# Patient Record
Sex: Female | Born: 1955 | Race: Black or African American | Hispanic: No | Marital: Married | Smoking: Never smoker
Health system: Southern US, Community
[De-identification: ages and names within clinical notes are randomized; demographics above are authoritative.]

## PROBLEM LIST (undated history)

## (undated) DIAGNOSIS — B019 Varicella without complication: Secondary | ICD-10-CM

## (undated) DIAGNOSIS — J309 Allergic rhinitis, unspecified: Secondary | ICD-10-CM

## (undated) DIAGNOSIS — D649 Anemia, unspecified: Secondary | ICD-10-CM

## (undated) DIAGNOSIS — K219 Gastro-esophageal reflux disease without esophagitis: Secondary | ICD-10-CM

## (undated) DIAGNOSIS — Z9889 Other specified postprocedural states: Secondary | ICD-10-CM

## (undated) DIAGNOSIS — K644 Residual hemorrhoidal skin tags: Secondary | ICD-10-CM

## (undated) DIAGNOSIS — K645 Perianal venous thrombosis: Secondary | ICD-10-CM

## (undated) DIAGNOSIS — Z0000 Encounter for general adult medical examination without abnormal findings: Principal | ICD-10-CM

## (undated) DIAGNOSIS — R0989 Other specified symptoms and signs involving the circulatory and respiratory systems: Secondary | ICD-10-CM

## (undated) DIAGNOSIS — Z8669 Personal history of other diseases of the nervous system and sense organs: Secondary | ICD-10-CM

## (undated) DIAGNOSIS — E663 Overweight: Secondary | ICD-10-CM

## (undated) DIAGNOSIS — G8929 Other chronic pain: Secondary | ICD-10-CM

## (undated) DIAGNOSIS — R0609 Other forms of dyspnea: Secondary | ICD-10-CM

## (undated) DIAGNOSIS — M549 Dorsalgia, unspecified: Secondary | ICD-10-CM

## (undated) HISTORY — DX: Anemia, unspecified: D64.9

## (undated) HISTORY — DX: Gastro-esophageal reflux disease without esophagitis: K21.9

## (undated) HISTORY — DX: Varicella without complication: B01.9

## (undated) HISTORY — DX: Personal history of other diseases of the nervous system and sense organs: Z86.69

## (undated) HISTORY — DX: Other forms of dyspnea: R06.09

## (undated) HISTORY — DX: Other specified postprocedural states: Z98.89

## (undated) HISTORY — DX: Residual hemorrhoidal skin tags: K64.4

## (undated) HISTORY — DX: Encounter for general adult medical examination without abnormal findings: Z00.00

## (undated) HISTORY — DX: Other chronic pain: G89.29

## (undated) HISTORY — DX: Perianal venous thrombosis: K64.5

## (undated) HISTORY — DX: Other specified symptoms and signs involving the circulatory and respiratory systems: R09.89

## (undated) HISTORY — DX: Allergic rhinitis, unspecified: J30.9

## (undated) HISTORY — DX: Dorsalgia, unspecified: M54.9

## (undated) HISTORY — PX: TUBAL LIGATION: SHX77

## (undated) HISTORY — PX: OTHER SURGICAL HISTORY: SHX169

## (undated) HISTORY — DX: Overweight: E66.3

## (undated) HISTORY — PX: HEMORRHOID SURGERY: SHX153

## (undated) HISTORY — PX: DILATION AND CURETTAGE OF UTERUS: SHX78

---

## 1997-07-21 ENCOUNTER — Ambulatory Visit (HOSPITAL_COMMUNITY): Admission: RE | Admit: 1997-07-21 | Discharge: 1997-07-21 | Payer: Self-pay | Admitting: Internal Medicine

## 1997-12-11 ENCOUNTER — Ambulatory Visit (HOSPITAL_COMMUNITY): Admission: RE | Admit: 1997-12-11 | Discharge: 1997-12-11 | Payer: Self-pay | Admitting: Obstetrics and Gynecology

## 1999-03-24 ENCOUNTER — Ambulatory Visit (HOSPITAL_COMMUNITY): Admission: RE | Admit: 1999-03-24 | Discharge: 1999-03-24 | Payer: Self-pay | Admitting: Internal Medicine

## 1999-03-24 ENCOUNTER — Encounter: Payer: Self-pay | Admitting: Internal Medicine

## 1999-07-14 ENCOUNTER — Encounter: Payer: Self-pay | Admitting: Internal Medicine

## 1999-07-14 ENCOUNTER — Ambulatory Visit (HOSPITAL_COMMUNITY): Admission: RE | Admit: 1999-07-14 | Discharge: 1999-07-14 | Payer: Self-pay | Admitting: Internal Medicine

## 2001-08-10 ENCOUNTER — Other Ambulatory Visit: Admission: RE | Admit: 2001-08-10 | Discharge: 2001-08-10 | Payer: Self-pay | Admitting: Internal Medicine

## 2001-08-17 ENCOUNTER — Encounter: Payer: Self-pay | Admitting: Internal Medicine

## 2001-08-17 ENCOUNTER — Encounter: Admission: RE | Admit: 2001-08-17 | Discharge: 2001-08-17 | Payer: Self-pay | Admitting: Internal Medicine

## 2002-08-26 ENCOUNTER — Encounter: Payer: Self-pay | Admitting: Internal Medicine

## 2002-08-26 ENCOUNTER — Other Ambulatory Visit: Admission: RE | Admit: 2002-08-26 | Discharge: 2002-08-26 | Payer: Self-pay | Admitting: Internal Medicine

## 2002-08-26 ENCOUNTER — Encounter: Admission: RE | Admit: 2002-08-26 | Discharge: 2002-08-26 | Payer: Self-pay | Admitting: Internal Medicine

## 2003-08-29 ENCOUNTER — Encounter: Admission: RE | Admit: 2003-08-29 | Discharge: 2003-08-29 | Payer: Self-pay | Admitting: Internal Medicine

## 2004-08-20 ENCOUNTER — Ambulatory Visit: Payer: Self-pay | Admitting: Internal Medicine

## 2004-08-27 ENCOUNTER — Other Ambulatory Visit: Admission: RE | Admit: 2004-08-27 | Discharge: 2004-08-27 | Payer: Self-pay | Admitting: Internal Medicine

## 2004-08-27 ENCOUNTER — Ambulatory Visit: Payer: Self-pay | Admitting: Internal Medicine

## 2004-09-07 ENCOUNTER — Encounter: Admission: RE | Admit: 2004-09-07 | Discharge: 2004-09-07 | Payer: Self-pay | Admitting: Internal Medicine

## 2004-11-24 ENCOUNTER — Ambulatory Visit: Payer: Self-pay | Admitting: Internal Medicine

## 2005-09-27 ENCOUNTER — Ambulatory Visit: Payer: Self-pay | Admitting: Internal Medicine

## 2005-09-30 ENCOUNTER — Encounter: Admission: RE | Admit: 2005-09-30 | Discharge: 2005-09-30 | Payer: Self-pay | Admitting: Internal Medicine

## 2005-10-06 ENCOUNTER — Ambulatory Visit: Payer: Self-pay | Admitting: Internal Medicine

## 2005-10-06 ENCOUNTER — Other Ambulatory Visit: Admission: RE | Admit: 2005-10-06 | Discharge: 2005-10-06 | Payer: Self-pay | Admitting: Internal Medicine

## 2005-10-06 ENCOUNTER — Encounter: Admission: RE | Admit: 2005-10-06 | Discharge: 2005-10-06 | Payer: Self-pay | Admitting: Internal Medicine

## 2005-12-02 ENCOUNTER — Ambulatory Visit: Payer: Self-pay | Admitting: Internal Medicine

## 2006-01-11 ENCOUNTER — Encounter: Admission: RE | Admit: 2006-01-11 | Discharge: 2006-01-11 | Payer: Self-pay | Admitting: Internal Medicine

## 2006-01-31 LAB — HM COLONOSCOPY: HM Colonoscopy: NORMAL

## 2006-05-31 ENCOUNTER — Ambulatory Visit: Payer: Self-pay | Admitting: Internal Medicine

## 2006-10-02 LAB — CONVERTED CEMR LAB

## 2006-10-04 ENCOUNTER — Encounter: Admission: RE | Admit: 2006-10-04 | Discharge: 2006-10-04 | Payer: Self-pay | Admitting: Internal Medicine

## 2006-10-16 ENCOUNTER — Ambulatory Visit: Payer: Self-pay | Admitting: Internal Medicine

## 2006-11-08 ENCOUNTER — Ambulatory Visit: Payer: Self-pay | Admitting: Gastroenterology

## 2006-11-09 ENCOUNTER — Ambulatory Visit: Payer: Self-pay | Admitting: Internal Medicine

## 2006-11-20 ENCOUNTER — Ambulatory Visit: Payer: Self-pay | Admitting: Gastroenterology

## 2006-11-22 ENCOUNTER — Ambulatory Visit: Payer: Self-pay | Admitting: Internal Medicine

## 2006-11-22 LAB — CONVERTED CEMR LAB
Albumin: 3.3 g/dL — ABNORMAL LOW (ref 3.5–5.2)
BUN: 4 mg/dL — ABNORMAL LOW (ref 6–23)
Basophils Absolute: 0 10*3/uL (ref 0.0–0.1)
Bilirubin Urine: NEGATIVE
Chloride: 111 meq/L (ref 96–112)
Cholesterol: 171 mg/dL (ref 0–200)
Eosinophils Absolute: 0.1 10*3/uL (ref 0.0–0.6)
GFR calc non Af Amer: 112 mL/min
HCT: 32.5 % — ABNORMAL LOW (ref 36.0–46.0)
HDL: 39.3 mg/dL (ref >39.0)
Hemoglobin, Urine: NEGATIVE
Ketones, ur: NEGATIVE mg/dL
LDL Cholesterol: 124 mg/dL — ABNORMAL HIGH (ref 0–99)
MCHC: 33.5 g/dL (ref 30.0–36.0)
MCV: 86.9 fL (ref 78.0–100.0)
Monocytes Absolute: 0.5 10*3/uL (ref 0.2–0.7)
Monocytes Relative: 9.7 % (ref 3.0–11.0)
Neutrophils Relative %: 50.5 % (ref 43.0–77.0)
Potassium: 4.3 meq/L (ref 3.5–5.1)
RBC: 3.73 M/uL — ABNORMAL LOW (ref 3.87–5.11)
Sodium: 144 meq/L (ref 135–145)
TSH: 1.58 microintl units/mL (ref 0.35–5.50)
Total CHOL/HDL Ratio: 4.4
Triglycerides: 40 mg/dL (ref 0–149)
Urobilinogen, UA: 0.2 (ref 0.0–1.0)

## 2007-02-07 ENCOUNTER — Encounter: Payer: Self-pay | Admitting: *Deleted

## 2007-02-07 DIAGNOSIS — C50919 Malignant neoplasm of unspecified site of unspecified female breast: Secondary | ICD-10-CM | POA: Insufficient documentation

## 2007-02-07 DIAGNOSIS — Z9010 Acquired absence of unspecified breast and nipple: Secondary | ICD-10-CM | POA: Insufficient documentation

## 2007-02-07 DIAGNOSIS — J309 Allergic rhinitis, unspecified: Secondary | ICD-10-CM | POA: Insufficient documentation

## 2007-02-07 DIAGNOSIS — Z8669 Personal history of other diseases of the nervous system and sense organs: Secondary | ICD-10-CM | POA: Insufficient documentation

## 2007-02-07 DIAGNOSIS — M549 Dorsalgia, unspecified: Secondary | ICD-10-CM | POA: Insufficient documentation

## 2007-02-07 DIAGNOSIS — Z9889 Other specified postprocedural states: Secondary | ICD-10-CM | POA: Insufficient documentation

## 2007-02-07 DIAGNOSIS — K645 Perianal venous thrombosis: Secondary | ICD-10-CM

## 2007-10-05 ENCOUNTER — Encounter: Admission: RE | Admit: 2007-10-05 | Discharge: 2007-10-05 | Payer: Self-pay | Admitting: Internal Medicine

## 2007-10-29 ENCOUNTER — Ambulatory Visit: Payer: Self-pay | Admitting: Internal Medicine

## 2007-11-09 ENCOUNTER — Telehealth: Payer: Self-pay | Admitting: Internal Medicine

## 2007-11-10 ENCOUNTER — Ambulatory Visit: Payer: Self-pay | Admitting: Family Medicine

## 2007-11-10 ENCOUNTER — Telehealth (INDEPENDENT_AMBULATORY_CARE_PROVIDER_SITE_OTHER): Payer: Self-pay | Admitting: *Deleted

## 2007-11-10 DIAGNOSIS — J029 Acute pharyngitis, unspecified: Secondary | ICD-10-CM | POA: Insufficient documentation

## 2007-11-16 ENCOUNTER — Ambulatory Visit: Payer: Self-pay | Admitting: Internal Medicine

## 2007-11-16 LAB — CONVERTED CEMR LAB
Albumin: 3.5 g/dL (ref 3.5–5.2)
Alkaline Phosphatase: 64 units/L (ref 39–117)
BUN: 5 mg/dL — ABNORMAL LOW (ref 6–23)
Bilirubin Urine: NEGATIVE
Cholesterol: 159 mg/dL (ref 0–200)
Eosinophils Absolute: 0.1 10*3/uL (ref 0.0–0.7)
Eosinophils Relative: 1.1 % (ref 0.0–5.0)
GFR calc Af Amer: 168 mL/min
GFR calc non Af Amer: 139 mL/min
HCT: 34.4 % — ABNORMAL LOW (ref 36.0–46.0)
HDL: 31.1 mg/dL — ABNORMAL LOW (ref >39.0)
Hemoglobin, Urine: NEGATIVE
Ketones, ur: NEGATIVE mg/dL
MCV: 86.9 fL (ref 78.0–100.0)
Monocytes Absolute: 0.5 10*3/uL (ref 0.1–1.0)
Platelets: 295 10*3/uL (ref 150–400)
Potassium: 3.7 meq/L (ref 3.5–5.1)
RDW: 12.4 % (ref 11.5–14.6)
TSH: 0.6 microintl units/mL (ref 0.35–5.50)
Total Protein, Urine: NEGATIVE mg/dL
Triglycerides: 57 mg/dL (ref 0–149)
Urine Glucose: NEGATIVE mg/dL
VLDL: 11 mg/dL (ref 0–40)
WBC: 6.6 10*3/uL (ref 4.5–10.5)

## 2007-11-20 ENCOUNTER — Ambulatory Visit: Payer: Self-pay | Admitting: Internal Medicine

## 2007-11-28 ENCOUNTER — Telehealth: Payer: Self-pay | Admitting: Internal Medicine

## 2008-03-05 ENCOUNTER — Ambulatory Visit: Payer: Self-pay | Admitting: Internal Medicine

## 2008-03-05 ENCOUNTER — Telehealth: Payer: Self-pay | Admitting: Internal Medicine

## 2008-08-22 ENCOUNTER — Telehealth: Payer: Self-pay | Admitting: Internal Medicine

## 2008-08-26 ENCOUNTER — Encounter (INDEPENDENT_AMBULATORY_CARE_PROVIDER_SITE_OTHER): Payer: Self-pay | Admitting: *Deleted

## 2008-09-10 ENCOUNTER — Ambulatory Visit: Payer: Self-pay | Admitting: Pulmonary Disease

## 2008-09-10 DIAGNOSIS — G4733 Obstructive sleep apnea (adult) (pediatric): Secondary | ICD-10-CM

## 2008-10-03 ENCOUNTER — Ambulatory Visit (HOSPITAL_BASED_OUTPATIENT_CLINIC_OR_DEPARTMENT_OTHER): Admission: RE | Admit: 2008-10-03 | Discharge: 2008-10-03 | Payer: Self-pay | Admitting: Pulmonary Disease

## 2008-10-03 ENCOUNTER — Encounter: Payer: Self-pay | Admitting: Pulmonary Disease

## 2008-10-12 ENCOUNTER — Ambulatory Visit: Payer: Self-pay | Admitting: Pulmonary Disease

## 2008-10-13 ENCOUNTER — Telehealth (INDEPENDENT_AMBULATORY_CARE_PROVIDER_SITE_OTHER): Payer: Self-pay | Admitting: *Deleted

## 2008-10-22 ENCOUNTER — Encounter: Admission: RE | Admit: 2008-10-22 | Discharge: 2008-10-22 | Payer: Self-pay | Admitting: Internal Medicine

## 2008-10-24 ENCOUNTER — Telehealth: Payer: Self-pay | Admitting: Pulmonary Disease

## 2008-10-24 ENCOUNTER — Ambulatory Visit: Payer: Self-pay | Admitting: Pulmonary Disease

## 2008-10-30 ENCOUNTER — Ambulatory Visit: Payer: Self-pay | Admitting: Internal Medicine

## 2008-11-12 ENCOUNTER — Ambulatory Visit: Payer: Self-pay | Admitting: Internal Medicine

## 2008-11-12 LAB — CONVERTED CEMR LAB
Alkaline Phosphatase: 56 units/L (ref 39–117)
BUN: 8 mg/dL (ref 6–23)
Bilirubin, Direct: 0.4 mg/dL — ABNORMAL HIGH (ref 0.0–0.3)
Calcium: 8.9 mg/dL (ref 8.4–10.5)
Cholesterol: 202 mg/dL — ABNORMAL HIGH (ref 0–200)
Creatinine, Ser: 0.6 mg/dL (ref 0.4–1.2)
Direct LDL: 156.1 mg/dL
Eosinophils Relative: 1.1 % (ref 0.0–5.0)
HCT: 34.9 % — ABNORMAL LOW (ref 36.0–46.0)
Hemoglobin, Urine: NEGATIVE
Hemoglobin: 11.5 g/dL — ABNORMAL LOW (ref 12.0–15.0)
MCV: 89.3 fL (ref 78.0–100.0)
Monocytes Relative: 6 % (ref 3.0–12.0)
Nitrite: NEGATIVE
Platelets: 276 10*3/uL (ref 150.0–400.0)
Specific Gravity, Urine: 1.01 (ref 1.000–1.030)
Total Bilirubin: 1.2 mg/dL (ref 0.3–1.2)
Total Protein, Urine: NEGATIVE mg/dL
Total Protein: 6.9 g/dL (ref 6.0–8.3)
Triglycerides: 55 mg/dL (ref 0.0–149.0)
Urine Glucose: NEGATIVE mg/dL
WBC: 7.6 10*3/uL (ref 4.5–10.5)
pH: 7 (ref 5.0–8.0)

## 2008-11-20 ENCOUNTER — Encounter: Payer: Self-pay | Admitting: Internal Medicine

## 2008-11-20 ENCOUNTER — Ambulatory Visit: Payer: Self-pay | Admitting: Internal Medicine

## 2008-11-20 ENCOUNTER — Other Ambulatory Visit: Admission: RE | Admit: 2008-11-20 | Discharge: 2008-11-20 | Payer: Self-pay | Admitting: Internal Medicine

## 2008-11-20 DIAGNOSIS — E785 Hyperlipidemia, unspecified: Secondary | ICD-10-CM

## 2008-12-08 ENCOUNTER — Encounter: Payer: Self-pay | Admitting: Internal Medicine

## 2009-01-20 ENCOUNTER — Telehealth: Payer: Self-pay | Admitting: Internal Medicine

## 2009-02-26 ENCOUNTER — Telehealth: Payer: Self-pay | Admitting: Internal Medicine

## 2009-04-24 ENCOUNTER — Telehealth: Payer: Self-pay | Admitting: Internal Medicine

## 2009-04-28 ENCOUNTER — Ambulatory Visit: Payer: Self-pay | Admitting: Internal Medicine

## 2009-05-01 ENCOUNTER — Telehealth: Payer: Self-pay | Admitting: Internal Medicine

## 2009-05-01 ENCOUNTER — Encounter (INDEPENDENT_AMBULATORY_CARE_PROVIDER_SITE_OTHER): Payer: Self-pay | Admitting: *Deleted

## 2009-06-02 ENCOUNTER — Encounter (INDEPENDENT_AMBULATORY_CARE_PROVIDER_SITE_OTHER): Payer: Self-pay | Admitting: *Deleted

## 2009-06-02 ENCOUNTER — Ambulatory Visit: Payer: Self-pay | Admitting: Internal Medicine

## 2009-06-02 LAB — CONVERTED CEMR LAB
Cholesterol: 179 mg/dL (ref 0–200)
HDL: 44.9 mg/dL (ref >39.00)
Triglycerides: 42 mg/dL (ref 0.0–149.0)
VLDL: 8.4 mg/dL (ref 0.0–40.0)

## 2009-06-08 ENCOUNTER — Ambulatory Visit: Payer: Self-pay | Admitting: Oncology

## 2009-06-08 ENCOUNTER — Encounter: Payer: Self-pay | Admitting: Internal Medicine

## 2009-06-09 ENCOUNTER — Encounter: Payer: Self-pay | Admitting: Internal Medicine

## 2009-07-08 ENCOUNTER — Encounter: Payer: Self-pay | Admitting: Internal Medicine

## 2009-07-24 ENCOUNTER — Encounter: Payer: Self-pay | Admitting: Internal Medicine

## 2009-10-22 ENCOUNTER — Ambulatory Visit: Payer: Self-pay | Admitting: Internal Medicine

## 2009-11-06 ENCOUNTER — Telehealth (INDEPENDENT_AMBULATORY_CARE_PROVIDER_SITE_OTHER): Payer: Self-pay | Admitting: *Deleted

## 2009-11-16 ENCOUNTER — Encounter: Payer: Self-pay | Admitting: Internal Medicine

## 2009-11-16 ENCOUNTER — Encounter: Admission: RE | Admit: 2009-11-16 | Discharge: 2009-11-16 | Payer: Self-pay | Admitting: Internal Medicine

## 2009-11-19 ENCOUNTER — Telehealth: Payer: Self-pay | Admitting: Internal Medicine

## 2009-11-19 ENCOUNTER — Ambulatory Visit: Payer: Self-pay | Admitting: Internal Medicine

## 2009-11-19 LAB — CONVERTED CEMR LAB
Albumin: 3.5 g/dL (ref 3.5–5.2)
Alkaline Phosphatase: 54 units/L (ref 39–117)
BUN: 8 mg/dL (ref 6–23)
Basophils Absolute: 0 10*3/uL (ref 0.0–0.1)
Bilirubin Urine: NEGATIVE
CO2: 28 meq/L (ref 19–32)
Calcium: 9 mg/dL (ref 8.4–10.5)
Creatinine, Ser: 0.6 mg/dL (ref 0.4–1.2)
Eosinophils Absolute: 0.1 10*3/uL (ref 0.0–0.7)
GFR calc non Af Amer: 142.69 mL/min (ref >60)
Glucose, Bld: 95 mg/dL (ref 70–99)
HDL: 40.8 mg/dL (ref >39.00)
Hemoglobin: 11.3 g/dL — ABNORMAL LOW (ref 12.0–15.0)
Ketones, ur: NEGATIVE mg/dL
Lymphocytes Relative: 34.2 % (ref 12.0–46.0)
MCHC: 33.6 g/dL (ref 30.0–36.0)
Monocytes Relative: 7.9 % (ref 3.0–12.0)
Neutro Abs: 3 10*3/uL (ref 1.4–7.7)
Platelets: 250 10*3/uL (ref 150.0–400.0)
RDW: 13.3 % (ref 11.5–14.6)
Sickle Cell Screen: NEGATIVE
Sodium: 140 meq/L (ref 135–145)
TSH: 1.98 microintl units/mL (ref 0.35–5.50)
Total Bilirubin: 0.5 mg/dL (ref 0.3–1.2)
Total Protein, Urine: NEGATIVE mg/dL
Triglycerides: 57 mg/dL (ref 0.0–149.0)
VLDL: 11.4 mg/dL (ref 0.0–40.0)
pH: 7.5 (ref 5.0–8.0)

## 2009-11-24 ENCOUNTER — Ambulatory Visit: Payer: Self-pay | Admitting: Internal Medicine

## 2009-11-24 ENCOUNTER — Encounter: Payer: Self-pay | Admitting: Internal Medicine

## 2009-11-24 ENCOUNTER — Telehealth: Payer: Self-pay | Admitting: Internal Medicine

## 2009-12-11 ENCOUNTER — Encounter: Admission: RE | Admit: 2009-12-11 | Discharge: 2009-12-11 | Payer: Self-pay | Admitting: Obstetrics and Gynecology

## 2009-12-26 ENCOUNTER — Encounter: Payer: Self-pay | Admitting: Internal Medicine

## 2010-02-21 ENCOUNTER — Encounter: Payer: Self-pay | Admitting: Internal Medicine

## 2010-03-02 NOTE — Progress Notes (Signed)
Summary: REQ FOR LABS  Phone Note Call from Patient Call back at or [phone]   Caller: Patient [phone] and/or [phone] Call For: Jacques Navy MD Summary of Call: Pt requesting lab for sickle cell trait, her daughters have it. Pt has lab orders for CPX. Pt is hoping to have labs drawn 10/10 - please let her know if she can have it and add to existing lab order if ok'd. Initial call taken by: Verdell Face,  November 06, 2009 12:58 PM  Follow-up for Phone Call        OK diagnosis code 282.5  [facility] test number 15000 Follow-up by: Jacques Navy MD,  November 06, 2009 4:53 PM  Additional Follow-up for Phone Call Additional follow up Details #1::        added to existing IDX order. Additional Follow-up by: Verdell Face,  November 09, 2009 8:45 AM

## 2010-03-02 NOTE — Miscellaneous (Signed)
Summary: Orders Update   Clinical Lists Changes  Orders: Added new Referral order of Genetic Counseling Referral (Genetic) - Signed

## 2010-03-02 NOTE — Progress Notes (Signed)
  Phone Note Other Incoming   Caller: pt Summary of Call: Pt called and stated that she [facility] to [facility]. she also would like Genetic counsiling as had been discussed at her last office visit. Pt would like to speak with you. Initial call taken by: Ami Bullins CMA,  May 01, 2009 9:24 AM  Follow-up for Phone Call        ok for referral. [facility] notified.  Tried to call - too long on hold.  left msg that I had called.  Follow-up by: Jacques Navy MD,  May 01, 2009 1:07 PM  Additional Follow-up for Phone Call Additional follow up Details #1::        Pt informed by DR Additional Follow-up by: Lamar Sprinkles, CMA,  May 01, 2009 1:13 PM

## 2010-03-02 NOTE — Letter (Signed)
Summary: [facility]  [facility]   Imported By: Sherian Rein 08/11/2009 14:05:32  _____________________________________________________________________  External Attachment:    Type:   Image     Comment:   External Document

## 2010-03-02 NOTE — Progress Notes (Signed)
  Phone Note Outgoing Call   Call placed by: Ami Bullins CMA,  November 19, 2009 11:43 AM Call placed to: Patient Summary of Call: called pt and informed her of labs she is having no symptoms consistent with UTI, she did mentioned that she was on her period at time of unrine colllection. Initial call taken by: Ami Bullins CMA,  November 19, 2009 11:44 AM  Follow-up for Phone Call        k Follow-up by: Jacques Navy MD,  November 19, 2009 12:48 PM

## 2010-03-02 NOTE — Letter (Signed)
   [facility] [address] [place], [place]  [address] Phone: [phone]      Jun 08, 2009   [facility] [address] [facility] [address] [place], [place] [address]  RE:  LAB RESULTS  Dear  Ms. Starnes,  The following is an interpretation of your most recent lab tests.  Please take note of any instructions provided or changes to medications that have resulted from your lab work.  Health professionals look at cholesterol as more involved than just the total cholesterol. We consider the level of LDL (bad) cholesterol, HDL (good), cholesterol, and Triglycerides (Grease) in the blood.  1. Your LDL should be under 100, and the HDL should be over 45, if you have any vascular disease such as heart attack, angina, stroke, TIA (mini stroke), claudication (pain in the legs when you walk due to poor circulation),  Abdominal Aortic Aneurysm (AAA), diabetes or prediabetes.  2. Your LDL should be under 130 if you have any two of the following:     a. Smoke or chew tobacco,     b. High blood pressure (if you are on medication or over 140/90 without medication),     c. Female gender,    d. HDL below 40,    e. A female relative (father, brother, or son), who have had any vascular event          as described in #1. above under the age of 110, or a female relative (mother,       sister, or daughter) who had an event as described above under age 76. (An HDL over 60 will subtract one risk factor from the total, so if you have two items in # 2 above, but an HDL over 60, you then fall into category # 3 below).  3. Your LDL should be under 160 if you have any one of the above.  Triglycerides should be under 200 with the ideal being under 150.  For diabetes or pre-diabetes, the ideal HgbA1C should be under 6.0%.  If you fall into any of the above categories, you should make a follow up appointment to discuss this with your physician.  LIPID PANEL:  Good - no changes needed Triglyceride: 42.0    Cholesterol: 179   LDL: 126   HDL: 44.90   Chol/HDL%:  4    LDL at 126 is at goal of 130 or less for a low to moderate risk patient. I would not initiate any medication at this time. Good Work !   Sincerely Yours,    Jacques Navy MD  Appended Document:  Pt informed

## 2010-03-02 NOTE — Letter (Signed)
Summary: Breast Prosthesis & bra/Second to [facility]  Breast Prosthesis & bra/Second to [employer]   Imported By: Sherian Rein 11/21/2009 11:06:17  _____________________________________________________________________  External Attachment:    Type:   Image     Comment:   External Document

## 2010-03-02 NOTE — Progress Notes (Signed)
Summary: Fungus?   Phone Note Call from Patient Call back at Home Phone [phone]   Caller: [phone] Summary of Call: Pt left vm. She has sores in her hair, a fungus on her leg and has lost 2 toenails. She wants to know if she should see a dermatologist or Dr Debby Bud.  Initial call taken by: Lamar Sprinkles, CMA,  February 26, 2009 4:52 PM  Follow-up for Phone Call        I believe she should see a dermatologist. 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

## 2010-03-02 NOTE — Progress Notes (Signed)
Summary: FAX  Phone Note Call from Patient   Summary of Call: Patient left vm req labs to be faxed to Dr Arlyce Harman. Completed - left vm for pt  Initial call taken by: Lamar Sprinkles, CMA,  November 24, 2009 10:57 AM

## 2010-03-02 NOTE — Letter (Signed)
   [facility] [address] [place], [place]  [address] Phone: [phone]      December 27, 2009   [facility] [address] [place], [place] [address]  RE:  LAB RESULTS  Dear  Ms. Tobon,  The following is an interpretation of your most recent lab tests.  Please take note of any instructions provided or changes to medications that have resulted from your lab work.   Bone Density study of November 11th reveals normal healthy  bone density.  Please come see me if you have any questions about these lab results.   Sincerely Yours,    Jacques Navy MD

## 2010-03-02 NOTE — Assessment & Plan Note (Signed)
Summary: breast pain x 2 wks / SD   Vital Signs:  Patient profile:   55 year old female Height:      65 inches (165.10 cm) Weight:      172 pounds (78.18 kg) BMI:     28.73 O2 Sat:      98 % on Room air Temp:     98.6 degrees F (37.00 degrees C) oral Pulse rate:   69 / minute Pulse rhythm:   regular BP sitting:   110 / 76  (left arm) Cuff size:   regular  Vitals Entered By: Brenton Grills (April 28, 2009 4:05 PM)  O2 Flow:  Room air CC: pt states she had pain left breast x 2 weeks around nipple area/no discharge/LMP: 03/18/09/also having recurring scalp problem/sore to touch/aj   Primary Care Provider:  Jacques Navy MD  CC:  pt states she had pain left breast x 2 weeks around nipple area/no discharge/LMP: 03/18/09/also having recurring scalp problem/sore to touch/aj.  History of Present Illness: patient with a h/o breast cancer s/p right mastectomy has been having discomfort in the left breast. She reports irregular periods since February and the tenderness occurs around the time she would normally have been premenstrual. No nipple discharge, no self exam determined nodule or mass. She is concerned about the discomfort.  Current Medications (verified): 1)  Advil 200 Mg  Tabs (Ibuprofen) .... Take As Needed 2)  Bc/ Cristy Hilts .... As Needed  Allergies (verified): No Known Drug Allergies  Past History:  Past Medical History: Last updated: 09/10/2008 Hx of EXTERNAL HEMORRHOIDS, THROMBOSED (ICD-455.4) Hx of CARCINOMA, BREAST, RIGHT (ICD-174.9) OTHER DYSPNEA AND RESPIRATORY ABNORMALITIES (ICD-786.09) SORE THROAT (ICD-462) Hx of EXTERNAL HEMORRHOIDS, THROMBOSED (ICD-455.4) DILATION AND CURETTAGE, HX OF (ICD-V45.89) Hx of CARCINOMA, BREAST, RIGHT (ICD-174.9) BACK PAIN, CHRONIC (ICD-724.5) ALLERGIC RHINITIS (ICD-477.9) CARPAL TUNNEL SYNDROME, HX OF (ICD-V12.49) MASTECTOMY, RIGHT, HX OF (ICD-V45.71) TUBAL LIGATION, HX OF (ICD-V26.51)    Past Surgical History: Last  updated: 02/07/2007 * EXCISION OF HEMORRHOID DILATION AND CURETTAGE, HX OF (ICD-V45.89) MASTECTOMY, RIGHT, HX OF (ICD-V45.71) TUBAL LIGATION, HX OF (ICD-V26.51)   PSH reviewed for relevance, FH reviewed for relevance  Review of Systems  The patient denies anorexia, weight loss, weight gain, chest pain, dyspnea on exertion, peripheral edema, abdominal pain, suspicious skin lesions, unusual weight change, and enlarged lymph nodes.    Physical Exam  General:  WNWD AA female in no distress Head:  Normocephalic and atraumatic without obvious abnormalities. No apparent alopecia or balding. Breasts:  s/p right mastectomy with normal appearing scar line. Left breast with normal nipple with no discharge. Careful exam with no findings of lumps, masses or other abnormality. Lungs:  normal respiratory effort.   Heart:  normal rate and regular rhythm.     Impression & Recommendations:  Problem # 1:  BREAST PAIN, LEFT (ICD-611.71) exam is negative. Suspect painful breast tissue is related to perimenopausal state.  Plan - see gyn as scheduled           no indication for early repeat mammogram           return for repeat exam if she detects any abnormality on self-exam.  Problem # 2:  Hx of CARCINOMA, BREAST, RIGHT (ICD-174.9) Patient's daughter has raised the issue of genetic counseling re: her breast cancer.  Plan - BRCA1, BRCA 2 assay.           if positive assay - referral for genetic counseling for patient and her 2 duaghters.  Complete Medication List: 1)  Advil 200 Mg Tabs (Ibuprofen) .... Take as needed 2)  Bc/ Cristy Hilts  .... As needed

## 2010-03-02 NOTE — Assessment & Plan Note (Signed)
Summary: flu shot/men/cd   Nurse Visit   Allergies: No Known Drug Allergies  Immunizations Administered:  Pneumonia Vaccine:    Vaccine Type: Pneumovax    Site: left deltoid    Mfr: Merck    Dose: 0.5 ml    Route: IM    Given by: Jarome Lamas    Exp. Date: 06/15/2011    Lot #: [id number]    VIS given: 01/05/09 version given October 22, 2009.  Orders Added: 1)  Admin 1st Vaccine [90471] 2)  Flu Vaccine 74yrs + [04540] 3)  Pneumococcal Vaccine [90732] 4)  Admin of Any Addtl Vaccine [98119] Flu Vaccine Consent Questions     Do you have a history of severe allergic reactions to this vaccine? no    Any prior history of allergic reactions to egg and/or gelatin? no    Do you have a sensitivity to the preservative Thimersol? no    Do you have a past history of Guillan-Barre Syndrome? no    Do you currently have an acute febrile illness? no    Have you ever had a severe reaction to latex? no    Vaccine information given and explained to patient? yes    Are you currently pregnant? no    Lot Number:[id number]   Exp Date:07/31/2010   Site Given  Left Deltoid IM .lbflu

## 2010-03-02 NOTE — Progress Notes (Signed)
Summary: OV Tuesday  Phone Note Call from Patient   Summary of Call: Pt c/o left breast tenderness x multiple wks. It has gotten somewhat better over the last wk. Pt has history of breast cancer. Advised office visit for eval. Pt wants to wait for Dr Debby Bud and I scheduled for office visit Tuesday. She will call office for any change in symptoms.  Initial call taken by: Lamar Sprinkles, CMA,  April 24, 2009 1:12 PM  Follow-up for Phone Call        k Follow-up by: Jacques Navy MD,  April 24, 2009 2:48 PM

## 2010-03-02 NOTE — Assessment & Plan Note (Signed)
Summary: PHYSICAL--STC   Vital Signs:  Patient profile:   55 year old female Height:      65 inches Weight:      170 pounds BMI:     28.39 O2 Sat:      98 % on Room air Temp:     97.8 degrees F oral Pulse rate:   88 / minute BP sitting:   128 / 68  (left arm) Cuff size:   regular  Vitals Entered By: Bill Salinas CMA (November 24, 2009 1:27 PM)  O2 Flow:  Room air CC: cpx/ ab   Primary Care Provider:  Jacques Navy MD  CC:  cpx/ ab.  History of Present Illness: Presents for wellness exam. She has had a change in work. Hr mother has been sick. Daughter has been diagnosed  with uclerative colitis. She is doing ok regards to her own health: she has pain in the left shoulder especially with adduction and rotation;  involuntary movement of the right index finger; hands ache; increased bruising.  She is otherwise doing well.   Current Medications (verified): 1)  Advil 200 Mg  Tabs (Ibuprofen) .... Take As Needed 2)  Bc/ Cristy Hilts .... As Needed  Allergies (verified): No Known Drug Allergies  Past History:  Past Medical History: Last updated: 09/10/2008 Hx of EXTERNAL HEMORRHOIDS, THROMBOSED (ICD-455.4) Hx of CARCINOMA, BREAST, RIGHT (ICD-174.9) OTHER DYSPNEA AND RESPIRATORY ABNORMALITIES (ICD-786.09) SORE THROAT (ICD-462) Hx of EXTERNAL HEMORRHOIDS, THROMBOSED (ICD-455.4) DILATION AND CURETTAGE, HX OF (ICD-V45.89) Hx of CARCINOMA, BREAST, RIGHT (ICD-174.9) BACK PAIN, CHRONIC (ICD-724.5) ALLERGIC RHINITIS (ICD-477.9) CARPAL TUNNEL SYNDROME, HX OF (ICD-V12.49) MASTECTOMY, RIGHT, HX OF (ICD-V45.71) TUBAL LIGATION, HX OF (ICD-V26.51)    Past Surgical History: Last updated: 02/07/2007 * EXCISION OF HEMORRHOID DILATION AND CURETTAGE, HX OF (ICD-V45.89) MASTECTOMY, RIGHT, HX OF (ICD-V45.71) TUBAL LIGATION, HX OF (ICD-V26.51)    Family History: Last updated: 09/10/2008 heart disease: paternal grandfather cancer: father (lung), paternal grandfather (prostate),  paternal uncles x 2 (prostate) sarcoidosis (mother   Social History: Last updated: 09/10/2008 Patient never smoked.  pt is married. pt has children. pt works as a Haematologist.   Review of Systems  The patient denies anorexia, fever, weight loss, weight gain, decreased hearing, hoarseness, chest pain, dyspnea on exertion, peripheral edema, headaches, abdominal pain, hematuria, difficulty walking, unusual weight change, enlarged lymph nodes, and angioedema.    Physical Exam  General:  WNWD AA female in no distress Head:  normocephalic, atraumatic, and no abnormalities observed.   Eyes:  vision grossly intact, pupils equal, pupils round, corneas and lenses clear, no injection, no iris abnormalities, no optic disk abnormalities, and no retinal abnormalitiies.   Ears:  External ear exam shows no significant lesions or deformities.  Otoscopic examination reveals clear canals, tympanic membranes are intact bilaterally without bulging, retraction, inflammation or discharge. Hearing is grossly normal bilaterally. Nose:  no external deformity and no external erythema.   Mouth:  Oral mucosa and oropharynx without lesions or exudates.  Teeth in good repair. Neck:  supple, full ROM, no masses, no thyromegaly, and no carotid bruits.   Chest Wall:  no deformities.   Breasts:  absent right breast. No masses or abnormalities along the scar line. Left breast - no nipple discharge, skin normal, no fixed mass or lesion. Lungs:  Normal respiratory effort, chest expands symmetrically. Lungs are clear to auscultation, no crackles or wheezes. Heart:  Normal rate and regular rhythm. S1 and S2 normal without gallop, murmur, click, rub or other extra  sounds. Abdomen:  soft, non-tender, normal bowel sounds, no guarding, and no hepatomegaly.   Genitalia:  deferred Msk:  normal ROM, no joint tenderness, no joint swelling, no joint warmth, no redness over joints, and no joint deformities.  No synovial thickening a the  PIP/DIP joints bilaterally. Left shoulder with normal passive ROM, no click, no crepitis. No tenderness at the insertion of the short head biceps, no tenderness at the lateral aspect shouder. Pulses:  2+ radial and DP pulses Extremities:  No clubbing, cyanosis, edema, or deformity noted with normal full range of motion of all joints.   Neurologic:  Sensation left forearm normal to light touch and pin prick.alert & oriented X3, cranial nerves II-XII intact, strength normal in all extremities, gait normal, and DTRs symmetrical and normal.   Skin:  turgor normal, color normal, no suspicious lesions, and no ulcerations.  small bruise right LE, right UE. Cervical Nodes:  no anterior cervical adenopathy and no posterior cervical adenopathy.   Axillary Nodes:  no R axillary adenopathy and no L axillary adenopathy.   Psych:  Oriented X3, memory intact for recent and remote, normally interactive, good eye contact, and not anxious appearing.     Impression & Recommendations:  Problem # 1:  HYPERLIPIDEMIA, MILD (ICD-272.4) Patient has been working on life-style management - better diet. LDL 1125 - normal range, down from 156 last year.  Plan - continue good work with diet.   Problem # 2:  OBSTRUCTIVE SLEEP APNEA (ICD-327.23) Doing well with CPAP. Feels more rested.   Problem # 3:  Hx of CARCINOMA, BREAST, RIGHT (ICD-174.9) Doing well. Normal exam and last mammogram was normal  Problem # 4:  Preventive Health Care (ICD-V70.0) Interval medical history unremarkable. Physical exam is normal. By her report pelvic exam with question enlarged fibroid - she is for U/S. Lab results are within normal limits. Current with colorectal cancer screening with last study Oct '08. Immunizations: Tetnus Feb '03, Pneumonia vaccine and influenza Sept 22,'11. 12 Lead EKG negative for any evidence of ischemia or injury. Patient is negative for sickle cell trait.   In summary - a delightful woman who is medically stable at  this time and regales in the absence of prescription medications in her life! She will return as needed or in one year.   Complete Medication List: 1)  Advil 200 Mg Tabs (Ibuprofen) .... Take as needed 2)  Bc/ Cristy Hilts  .... As needed  Patient: Chelsea Gordon Note: All result statuses are Final unless otherwise noted.  Tests: (1) BMP (METABOL)   Sodium                    140 mEq/L                   135-145   Potassium                 4.2 mEq/L                   3.5-5.1   Chloride                  107 mEq/L                   96-112   Carbon Dioxide            28 mEq/L                    19-32  Glucose                   95 mg/dL                    16-10   BUN                       8 mg/dL                     9-60   Creatinine                0.6 mg/dL                   4.5-4.0   Calcium                   9.0 mg/dL                   9.8-11.9   GFR                       142.69 mL/min               >60  Tests: (2) CBC Platelet w/Diff (CBCD)   White Cell Count          5.4 K/uL                    4.5-10.5   Red Cell Count       [L]  3.81 Mil/uL                 3.87-5.11   Hemoglobin           [L]  11.3 g/dL                   14.7-82.9   Hematocrit           [L]  33.6 %                      36.0-46.0   MCV                       88.1 fl                     78.0-100.0   MCHC                      33.6 g/dL                   56.2-13.0   RDW                       13.3 %                      11.5-14.6   Platelet Count            250.0 K/uL                  150.0-400.0   Neutrophil %              56.3 %                      43.0-77.0   Lymphocyte %              34.2 %  12.0-46.0   Monocyte %                7.9 %                       3.0-12.0   Eosinophils%              1.0 %                       0.0-5.0   Basophils %               0.6 %                       0.0-3.0   Neutrophill Absolute      3.0 K/uL                    1.4-7.7   Lymphocyte Absolute       1.8 K/uL                     0.7-4.0   Monocyte Absolute         0.4 K/uL                    0.1-1.0  Eosinophils, Absolute                             0.1 K/uL                    0.0-0.7   Basophils Absolute        0.0 K/uL                    0.0-0.1  Tests: (3) Hepatic/Liver Function Panel (HEPATIC)   Total Bilirubin           0.5 mg/dL                   3.4-7.4   Direct Bilirubin          0.1 mg/dL                   2.5-9.5   Alkaline Phosphatase      54 U/L                      39-117   AST                       17 U/L                      0-37   ALT                       13 U/L                      0-35   Total Protein             6.7 g/dL                    6.3-8.7   Albumin                   3.5 g/dL  3.5-5.2  Tests: (4) TSH (TSH)   FastTSH                   1.98 uIU/mL                 0.35-5.50  Tests: (5) Lipid Panel (LIPID)   Cholesterol               178 mg/dL                   1-610     ATP III Classification            Desirable:  < 200 mg/dL                    Borderline High:  200 - 239 mg/dL               High:  > = 240 mg/dL   Triglycerides             57.0 mg/dL                  9.6-045.4     Normal:  <150 mg/dL     Borderline High:  098 - 199 mg/dL   HDL                       11.91 mg/dL                 >47.82   VLDL Cholesterol          11.4 mg/dL                  9.5-62.1   LDL Cholesterol      [H]  308 mg/dL                   6-57  CHO/HDL Ratio:  CHD Risk                             4                    Men          Women     1/2 Average Risk     3.4          3.3     Average Risk          5.0          4.4     2X Average Risk          9.6          7.1     3X Average Risk          15.0          11.0                           Tests: (6) UDip w/Micro (URINE)   Color                     LT. YELLOW       RANGE:  Yellow;Lt. Yellow   Clarity                   CLEAR                       Clear  Specific Gravity          1.015                       1.000 -  1.030   Urine Ph                  7.5                         5.0-8.0   Protein                   NEGATIVE                    Negative   Urine Glucose             NEGATIVE                    Negative   Ketones                   NEGATIVE                    Negative   Urine Bilirubin           NEGATIVE                    Negative   Blood                     SMALL                       Negative   Urobilinogen              1.0                         0.0 - 1.0   Leukocyte Esterace        NEGATIVE                    Negative   Nitrite                   NEGATIVE                    Negative   Urine WBC                 0-2/hpf                     0-2/hpf   Urine RBC                 0-2/hpf                     0-2/hpf   Urine Mucus               Presence of                 None   Urine Epith               Rare(0-4/hpf)               Rare(0-4/hpf)   Renal Epithelial          Few(5-10/hpf)               None   Urine Bacteria  Many(>50/hpf)               None Tests: (1) Sickle Cell Screen (15000)   Sickle Cell Screen        NEG  Orders Added: 1)  Est. Patient 40-64 years [99396] 2)  Est. Patient Level II [16109]

## 2010-04-30 ENCOUNTER — Other Ambulatory Visit: Payer: Self-pay | Admitting: Internal Medicine

## 2010-04-30 DIAGNOSIS — Z9010 Acquired absence of unspecified breast and nipple: Secondary | ICD-10-CM

## 2010-06-15 NOTE — Assessment & Plan Note (Signed)
[facility]                           PRIMARY CARE OFFICE NOTE   NAME:CHERRYConley, Chelsea Gordon                        MRN:          [id number]  DATE:10/16/2006                            DOB:          Oct 13, 1955    Chelsea Gordon is a very delightful 55 year old African-American woman well  known to me, who presents for followup evaluation and exam.  She was  last seen in the office May 31, 2006, for some postnasal drainage,  otalgia, and irregular menses.  She was last seen October 06, 2005, for  a complete physical exam.   INTERVAL HISTORY:  Unremarkable with the patient having no major medical  problems.  She does continue to have irregular menses and did recently  undergo a D&C by her gynecologist, Dr. Chilton Si.  Final results from this  procedure are pending, but her understanding is that it was unremarkable  with no abnormalities.   The patient reports she has several days where she had a tremor-like  movement in both thumb which did resolve spontaneously.   The patient had been advised by a co-worker she seemed to swallow a lot  and then became very self-conscious about this.  She does seem to have  some postnasal drainage.  She does have a history of allergic rhinitis  with snoring and does have sore throats in the a.m. most likely  secondary to mouth breathing.  The patient had tried a decongestant with  no significant improvement in her symptoms but just some thickening of  her mucus discharge.  She does not have other symptoms and sees no  allergies, specifically denying itchy eyes, runny eyes, runny nose.   PAST MEDICAL HISTORY:   SURGICAL:  1. Tubal ligation, 1991.  2. Right mastectomy in 1992.   MEDICAL:  1. The patient had the usual childhood disease.  2. History of carpal tunnel.  3. Allergic rhinitis.  4. Chronic back pain.  5. Breast cancer status post mastectomy.   FAMILY HISTORY:  The patient's mother had diabetes.  There is a  family  history of cardiovascular disease.  She did have an aunt with breast  cancer.  Her father died of lung cancer and passed in September 2004.   SOCIAL HISTORY:  The patient is now married 26 years.  One daughter  graduated from [place].  One daughter is current at [employer] for a summer  student program.   REVIEW OF SYSTEMS:  The patient has had no fever, sweats, or chills.  She has had no major constitutional symptoms.  She has no ENT,  cardiovascular, respiratory, GI, or GU complaints.   CURRENT MEDICATIONS:  No prescription drugs.  The patient does take cod  liver oil daily, vitamin C daily, Advil on a p.r.n. basis.   EXAMINATION:  Temperature was 99.6, blood pressure 124/69, pulse 59,  weight 174.  GENERAL APPEARANCE:  This is a well-nourished, well-developed mildly  overweight woman in no acute distress.  HEENT:  Normocephalic, atraumatic.  EACs and TMs were normal.  Oral  pharynx with native dentition.  She does have an area at the  left lower  premolar at the gum line which looks sore and irritated, but no other  changes are noted in the oral cavity.  Posterior pharynx was clear.  Conjunctivae and sclerae was clear.  PERRLA, EOMI.  Funduscopic exam was  unremarkable.  NECK:  Supple.  There is no thyromegaly.  NODES:  No adenopathy was noted in the cervical, supraclavicular  regions.  CHEST:  No CVA tenderness.  LUNGS:  Clear with no rales, wheezes, or rhonchi.  BREAST EXAM:  Deferred to gynecology.  CARDIOVASCULAR:  2+ radial pulses, no JVD, no carotid bruits.  She had a  quiet precordium with a regular rate and rhythm without murmurs, rubs,  or gallops.  ABDOMEN:  Soft, no guarding or rebound.  PELVIC AND RECTAL:  Deferred to gynecology.  EXTREMITIES:  Without cyanosis, clubbing, edema, nor deformities are  noted.   Last laboratory was August 2007, and the patient would be a candidate  for a followup laboratory at her convenience, although there is no  pressing need given  she is on no medication and is medically stable.  Her last cholesterol level was acceptable at 170 with an LDL that was  below goal at 125 with a goal of 130.  HDL was normal.  Thyroid function  was likewise normal.   ASSESSMENT/PLAN:  1. Gynecologic.  The patient with perimenopausal symptoms.  If she      develops increasing symptoms, I would have her discuss treatments      with her gynecologist.  I did tell her there were many alternatives      to the use of estrogens.  2. Allergic rhinitis.  I suspect the patient's swallowing is due to      postnasal drainage.  I have encouraged her to consider the use of      loratadine or other nonsedating antihistamines if her symptoms are      persistent, but this would be totally optional.  3. History of breast cancer.  The patient is currently stable.  Last      mammogram from October 04, 2006, was unremarkable.  She does seem      clinically stable.  4. Health maintenance.  The patient is turning 51 and would be a      candidate for colorectal cancer screening with colonoscopy.  I will      be happy to schedule this for her at her convenience.   In summary, the patient seems medically stable at this time.  She is  asked to return to see me on a p.r.n. basis.     Rosalyn Gess Norins, MD  Electronically Signed    MEN/MedQ  DD: 10/17/2006  DT: 10/17/2006  Job #: [id number]   cc:   Janetta Hora [id number] Erskine Speed, [address]

## 2010-06-18 NOTE — Assessment & Plan Note (Signed)
[facility]                             PRIMARY CARE OFFICE NOTE   NAME:Chelsea Gordon, Chelsea Gordon                        MRN:          [id number]  DATE:10/06/2005                            DOB:          [birth year]    Chelsea Gordon is a very delightful 54 year old African American woman who  presents for followup evaluation.  She was last seen August 27, 2004.  In the  interval the patient reports that she has generally been doing well.  She  does have a concern for increasing skin lesions.  She describes several on  her breasts that are raised and dark.   Patient did have a recent mammogram September 30, 2005 which was a normal  screening mammogram on the left.  This is read out as showing a  fibroglandular pattern with possible mass noted in the left breast.  Spot  compression views and possibly sonography was recommended.  The patient is  going for this, this afternoon in several hours.  Of note the patient's  previous mammogram from September 07, 2004 was a unilateral left diagnostic  mammogram due to the heavy dense parenchyma of her breast and this was a  negative study.   PAST MEDICAL HISTORY, FAMILY HISTORY AND SOCIAL HISTORY:  Are well  documented in my note of August 27, 2004 with no significant change.   INTERVAL SOCIAL HISTORY:  The patient's older daughter has graduated from  [place].  Her younger daughter is currently attending a [employer]  in [place] and is a Health and safety inspector, looking at [employer] with interest in  Energy manager.   REVIEW OF SYSTEMS:  Patient has had no constitutional problems, __________,  ENT, cardiovascular, respiratory, GI or GU complaints.   EXAMINATION:  VITAL SIGNS:  Temperature was 97.8.  Blood pressure 111/71.  Pulse was 69.  Weight 172 pounds.  GENERAL APPEARANCE:  A well-nourished, well-developed woman, looks younger  than her stated chronologic age in no acute distress.  HEENT:  Normocephalic.  Atraumatic.   EACs and TMs were normal. Oropharynx  was negative, dentition in good repair.  No buccal or palate lesions were  noted.  Posterior pharynx was clear.  Conjunctivae, sclerae was clear.  Pupils equal, round and reactive to light and accommodation.  Funduscopic  exam with hand held instrument revealed normal vascular markings with no  palpable edema.  NECK:  Supple.  There was no thyromegaly nodes.  No adenopathy was noted in  the cervical, supraclavicular, axillary regions.  CHEST:  No CVA tenderness.  Lungs were clear to auscultation and percussion.  BREAST:  Left breast with normal skin.  Nipples without discharge.  No fixed  mass lesion or abnormality was palpable.  Right breast scar is smooth with  no nodules.  No abnormalities are noted.  There is no axillary adenopathy  bilaterally.  CARDIOVASCULAR:  2+ radial pulses.  No JVD, no carotid bruits. She had a  quiet precordium.  She had a regular rate and rhythm without murmurs, rubs  or gallops.  ABDOMEN: Soft. No guarding or rebound.  No organosplenomegaly was  noted.  PELVIC:  EGBUS was normal.  Vaginal mucosa appeared normal.  There is a  scant amount of thick discharge in the posterior fornix.  Cervix appeared  normal. Pap scraping and cervical brushing performed without difficulty.  Bimanual exam revealed no cervical motion tenderness, no adnexal enlargement  or abnormality.  EXTREMITIES:  Without clubbing, cyanosis, edema or deformity.  NEUROLOGIC:  Non-focal.  DERMATOLOGY:  Patient has multiple small skin tags.  She has several small  keratotic lesions.  She has two prominent lesions on the left breast at the  8 and 10 o'clock position.  These have the appearance of keratotic lesions.   DATA BASE:  Mammogram as above.  Hemoglobin 11.7 grams and stable.  White count 6,100 with a normal  differential.  Cholesterol is 170.  Triglycerides 24.  HDL was 40.2.  LDL  was 125.  Chemistries were normal with a blood sugar of 101.   Kidney  function normal with a creatinine of 0.7 with a GFR of 115 milliliters per  minute.  Liver functions were normal.  Thyroid function normal with a TSH of  1.18. Urinalysis was negative.   MEDICATIONS:  None except p.r.n. Advil.   CHART REVIEW:  Patient had bone scan performed August 24, 1994 which was  negative and a note from [facility] September 17, 2003  when the patient was diagnosed with seborrheic dermatitis and folliculitis  and was recommended to use Loprox shampoo, Dermasmoothe/FS oil, prednisone  and __________ at that time.  Patient was seen by Janalyn Harder October 2002  similar problems.   ASSESSMENT/PLAN:  1. Oncology.  Patient with a history of breast cancer in 1992.  She has      done extremely well.  She has had normal mammograms but has dense      parenchymal tissue.  She has been recalled for diagnostic study and      ultrasound which I suspect will be normal.  2. Health maintenance.  Patient was a normal pelvic and Pap smear on      today's visit.  Patient will be notified of her Pap results.      a.     At age 42, the patient would be a candidate for colorectal       cancer screening.  She otherwise is current and up to date.  3. Overall patient is a healthy woman with no active medical problems.                                   Chelsea Gess Norins, MD   MEN/MedQ  DD:  10/07/2005  DT:  10/07/2005  Job #:  [id number]   cc:   Erskine Speed

## 2010-10-15 ENCOUNTER — Ambulatory Visit: Payer: Self-pay

## 2010-10-15 ENCOUNTER — Ambulatory Visit (INDEPENDENT_AMBULATORY_CARE_PROVIDER_SITE_OTHER): Payer: Managed Care, Other (non HMO) | Admitting: *Deleted

## 2010-10-15 DIAGNOSIS — Z23 Encounter for immunization: Secondary | ICD-10-CM

## 2010-11-15 ENCOUNTER — Other Ambulatory Visit: Payer: Self-pay

## 2010-11-18 ENCOUNTER — Ambulatory Visit: Payer: Self-pay

## 2010-11-18 ENCOUNTER — Encounter: Payer: Self-pay | Admitting: Internal Medicine

## 2010-11-23 ENCOUNTER — Ambulatory Visit
Admission: RE | Admit: 2010-11-23 | Discharge: 2010-11-23 | Disposition: A | Discharge status: Home / Self Care | Payer: Managed Care, Other (non HMO) | Source: Ambulatory Visit | Attending: Internal Medicine | Admitting: Internal Medicine

## 2010-11-23 DIAGNOSIS — Z9010 Acquired absence of unspecified breast and nipple: Secondary | ICD-10-CM

## 2010-12-24 ENCOUNTER — Other Ambulatory Visit: Payer: Self-pay | Admitting: Internal Medicine

## 2010-12-24 ENCOUNTER — Other Ambulatory Visit (INDEPENDENT_AMBULATORY_CARE_PROVIDER_SITE_OTHER): Payer: Managed Care, Other (non HMO)

## 2010-12-24 DIAGNOSIS — Z0000 Encounter for general adult medical examination without abnormal findings: Secondary | ICD-10-CM

## 2010-12-24 LAB — CBC WITH DIFFERENTIAL/PLATELET
Basophils Relative: 0.3 % (ref 0.0–3.0)
Eosinophils Absolute: 0.1 10*3/uL (ref 0.0–0.7)
Eosinophils Relative: 1 % (ref 0.0–5.0)
Hemoglobin: 11.4 g/dL — ABNORMAL LOW (ref 12.0–15.0)
Lymphocytes Relative: 32.9 % (ref 12.0–46.0)
MCHC: 33.4 g/dL (ref 30.0–36.0)
Monocytes Relative: 8.4 % (ref 3.0–12.0)
Neutrophils Relative %: 57.4 % (ref 43.0–77.0)
RBC: 3.86 Mil/uL — ABNORMAL LOW (ref 3.87–5.11)
WBC: 5.5 10*3/uL (ref 4.5–10.5)

## 2010-12-24 LAB — BASIC METABOLIC PANEL
CO2: 26 mEq/L (ref 19–32)
Chloride: 106 mEq/L (ref 96–112)
Potassium: 3.9 mEq/L (ref 3.5–5.1)

## 2010-12-24 LAB — HEPATIC FUNCTION PANEL
ALT: 15 U/L (ref 0–35)
AST: 17 U/L (ref 0–37)
Alkaline Phosphatase: 58 U/L (ref 39–117)
Bilirubin, Direct: 0 mg/dL (ref 0.0–0.3)
Total Bilirubin: 0.7 mg/dL (ref 0.3–1.2)
Total Protein: 7.3 g/dL (ref 6.0–8.3)

## 2010-12-24 LAB — LIPID PANEL
LDL Cholesterol: 115 mg/dL — ABNORMAL HIGH (ref 0–99)
Total CHOL/HDL Ratio: 3

## 2010-12-24 LAB — TSH: TSH: 1.22 u[IU]/mL (ref 0.35–5.50)

## 2010-12-28 ENCOUNTER — Encounter: Payer: Self-pay | Admitting: Internal Medicine

## 2010-12-28 ENCOUNTER — Other Ambulatory Visit (HOSPITAL_COMMUNITY)
Admission: RE | Admit: 2010-12-28 | Discharge: 2010-12-28 | Disposition: A | Discharge status: Home / Self Care | Payer: Managed Care, Other (non HMO) | Source: Ambulatory Visit | Attending: Obstetrics and Gynecology | Admitting: Obstetrics and Gynecology

## 2010-12-28 ENCOUNTER — Other Ambulatory Visit: Payer: Self-pay | Admitting: Obstetrics and Gynecology

## 2010-12-28 ENCOUNTER — Ambulatory Visit (INDEPENDENT_AMBULATORY_CARE_PROVIDER_SITE_OTHER): Payer: Managed Care, Other (non HMO) | Admitting: Internal Medicine

## 2010-12-28 DIAGNOSIS — C50919 Malignant neoplasm of unspecified site of unspecified female breast: Secondary | ICD-10-CM

## 2010-12-28 DIAGNOSIS — E785 Hyperlipidemia, unspecified: Secondary | ICD-10-CM

## 2010-12-28 DIAGNOSIS — Z0000 Encounter for general adult medical examination without abnormal findings: Secondary | ICD-10-CM | POA: Insufficient documentation

## 2010-12-28 DIAGNOSIS — Z124 Encounter for screening for malignant neoplasm of cervix: Secondary | ICD-10-CM | POA: Insufficient documentation

## 2010-12-28 NOTE — Assessment & Plan Note (Signed)
Interval medical history - no major illness, surgery or injury. Physical exam, sans breast and pelvic, normal. Lab results are in normal range. She is current with colorectal cancer screening. Current with mammography. Immunizations: current tetanus and flu.  In summary - a very nice woman who is medically stable. She has had a stressful year with family illness and issues at work. She is encouraged to continue a healthy life-style. She will return as needed or in one year.

## 2010-12-28 NOTE — Assessment & Plan Note (Signed)
Doing very well. Follows with Gyn, Careers adviser and oncologist

## 2010-12-28 NOTE — Progress Notes (Signed)
Subjective:    Patient ID: Chelsea Gordon, female    DOB: [birth year], 55 y.o.   MRN: [id number]  HPI She presents for annual follow-up. She has had a lot of illness in the family but she herself has been healthy. She has a 1400 appt with Gyn. She is feeling tired at times, she has periods of emotionality related of work and issues. She does have a problem with dry eyes - is using drops otc and follows with her ophthalmologist.   Past Medical History  Diagnosis Date  . External hemorrhoids   . History of breast cancer     Right Breast  . Snoring disorder   . External thrombosed hemorrhoids   . Other postprocedural status   . Chronic back pain   . Allergic rhinitis   . History of carpal tunnel syndrome    Past Surgical History  Procedure Date  . Mastectomy,right   . Tubal ligation   . Hemorrhoid surgery   . Dilation and curettage of uterus    Family History  Problem Relation Age of Onset  . Cancer Father     lung  . Heart disease Maternal Grandfather   . Cancer Maternal Grandfather     prostate  . Cancer Paternal Uncle     Prostate cancer   History   Social History  . Marital Status: Married    Spouse Name: N/A    Number of Children: 2  . Years of Education: 16   Occupational History  . banking    Social History Main Topics  . Smoking status: Never Smoker   . Smokeless tobacco: Never Used  . Alcohol Use: Yes  . Drug Use: No  . Sexually Active: Yes -- Female partner(s)   Other Topics Concern  . 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 Nov '12 - tough year - SO w/ CVA,  dtr's ill, work with many changes and increased stress.              Review of Systems Constitutional:  Negative for fever, chills, activity change and unexpected weight change.  HEENT:  Negative for hearing loss, ear pain, congestion, neck stiffness and postnasal drip. Negative for sore throat or  swallowing problems. Negative for dental complaints.   Eyes: Negative for vision loss or change in visual acuity.  Respiratory: Negative for chest tightness and wheezing. Negative for DOE.   Cardiovascular: Negative for chest pain or palpitations. No decreased exercise tolerance Gastrointestinal: No change in bowel habit. No bloating or gas. No reflux or indigestion Genitourinary: Negative for urgency, frequency, flank pain and difficulty urinating.  Musculoskeletal: Negative for myalgias, back pain,and gait problem. Hand pain Neurological: Negative for dizziness, tremors, weakness and headaches.  Hematological: Negative for adenopathy.  Psychiatric/Behavioral: Negative for behavioral problems and dysphoric mood.       Objective:   Physical Exam Vitals reviewed. Gen'l: well nourished, well developed AA woman in no distress HEENT - [place]/AT, EACs/TMs normal, oropharynx with native dentition in good condition, no buccal or palatal lesions, posterior pharynx clear, mucous membranes moist. C&S clear, PERRLA, fundi - normal Neck - supple, no thyromegaly Nodes- negative submental, cervical, supraclavicular regions Chest - no deformity, no CVAT Lungs - cleat without rales, wheezes. No increased work of breathing Breast - deferred to oncology,surgery Cardiovascular - regular rate and rhythm, quiet precordium, no murmurs, rubs or gallops, 2+ radial, DP and PT pulses  Abdomen - BS+ x 4, no HSM, no guarding or rebound or tenderness Pelvic - deferred to gyn Rectal - deferred to gyn Extremities - no clubbing, cyanosis, edema or deformity.  Neuro - A&O x 3, CN II-XII normal, motor strength normal and equal, DTRs 2+ and symmetrical biceps, radial, and patellar tendons. Cerebellar - no tremor, no rigidity, fluid movement and normal gait. Derm - Head, neck, back, abdomen and extremities without suspicious lesions  Lab Results  Component Value Date   WBC 5.5 12/24/2010   HGB 11.4* 12/24/2010   HCT 34.1*  12/24/2010   PLT 301.0 12/24/2010   GLUCOSE 98 12/24/2010   CHOL 178 12/24/2010   TRIG 52.0 12/24/2010   HDL 53.00 12/24/2010   LDLDIRECT 156.1 11/12/2008   LDLCALC 115* 12/24/2010   ALT 15 12/24/2010   AST 17 12/24/2010   NA 140 12/24/2010   K 3.9 12/24/2010   CL 106 12/24/2010   CREATININE 0.7 12/24/2010   BUN 10 12/24/2010   CO2 26 12/24/2010   TSH 1.22 12/24/2010           Assessment & Plan:

## 2010-12-28 NOTE — Assessment & Plan Note (Signed)
Lab Results  Component Value Date   CHOL 178 12/24/2010   HDL 53.00 12/24/2010   LDLCALC 115* 12/24/2010   LDLDIRECT 156.1 11/12/2008   TRIG 52.0 12/24/2010   CHOLHDL 3 12/24/2010   LDL is better than goal of 130 or less in patient with average risk. Plan - healthy life-style: low fat diet and exercise

## 2011-07-19 ENCOUNTER — Telehealth: Payer: Self-pay | Admitting: Internal Medicine

## 2011-07-19 NOTE — Telephone Encounter (Signed)
perfectly for patient to have a copy of her records.

## 2011-07-19 NOTE — Telephone Encounter (Signed)
Caller: Donnalyn/Patient; Phone Number: [phone];  Calling about optaining a copy of medical records for the patient.

## 2011-10-18 ENCOUNTER — Ambulatory Visit: Payer: Managed Care, Other (non HMO)

## 2011-10-19 ENCOUNTER — Ambulatory Visit (INDEPENDENT_AMBULATORY_CARE_PROVIDER_SITE_OTHER): Payer: Managed Care, Other (non HMO) | Admitting: *Deleted

## 2011-10-19 DIAGNOSIS — Z23 Encounter for immunization: Secondary | ICD-10-CM

## 2011-10-25 ENCOUNTER — Other Ambulatory Visit: Payer: Self-pay | Admitting: Internal Medicine

## 2011-10-25 DIAGNOSIS — N644 Mastodynia: Secondary | ICD-10-CM

## 2011-10-27 ENCOUNTER — Ambulatory Visit
Admission: RE | Admit: 2011-10-27 | Discharge: 2011-10-27 | Disposition: A | Discharge status: Home / Self Care | Payer: Managed Care, Other (non HMO) | Source: Ambulatory Visit | Attending: Internal Medicine | Admitting: Internal Medicine

## 2011-10-27 DIAGNOSIS — N644 Mastodynia: Secondary | ICD-10-CM

## 2011-10-31 ENCOUNTER — Encounter: Payer: Self-pay | Admitting: Internal Medicine

## 2012-02-01 LAB — HM PAP SMEAR: HM Pap smear: NORMAL

## 2012-02-20 ENCOUNTER — Ambulatory Visit (INDEPENDENT_AMBULATORY_CARE_PROVIDER_SITE_OTHER): Payer: Managed Care, Other (non HMO) | Admitting: Internal Medicine

## 2012-02-20 ENCOUNTER — Other Ambulatory Visit (INDEPENDENT_AMBULATORY_CARE_PROVIDER_SITE_OTHER): Payer: Managed Care, Other (non HMO)

## 2012-02-20 ENCOUNTER — Encounter: Payer: Self-pay | Admitting: Internal Medicine

## 2012-02-20 VITALS — BP 120/72 | HR 67 | Temp 99.0°F | Resp 10 | Wt 167.1 lb

## 2012-02-20 DIAGNOSIS — Z0000 Encounter for general adult medical examination without abnormal findings: Secondary | ICD-10-CM

## 2012-02-20 DIAGNOSIS — G4733 Obstructive sleep apnea (adult) (pediatric): Secondary | ICD-10-CM

## 2012-02-20 DIAGNOSIS — Z23 Encounter for immunization: Secondary | ICD-10-CM

## 2012-02-20 DIAGNOSIS — C50919 Malignant neoplasm of unspecified site of unspecified female breast: Secondary | ICD-10-CM

## 2012-02-20 DIAGNOSIS — E785 Hyperlipidemia, unspecified: Secondary | ICD-10-CM

## 2012-02-20 LAB — HEMOGLOBIN AND HEMATOCRIT, BLOOD
HCT: 36.6 % (ref 36.0–46.0)
Hemoglobin: 12.1 g/dL (ref 12.0–15.0)

## 2012-02-20 MED ORDER — TETANUS-DIPHTHERIA TOXOIDS TD 5-2 LFU IM INJ: 0.5000 mL | INJECTION | Freq: Once | INTRAMUSCULAR | Status: DC

## 2012-02-20 NOTE — Patient Instructions (Addendum)
Thanks for coming to see me.  Take good care of yourself and take an extra large dose of patience with #2 daughter - I am confident it will work out for her.  Please come back in 1 year or sooner as needed.

## 2012-02-20 NOTE — Progress Notes (Signed)
Subjective:    Patient ID: Chelsea Gordon, female    DOB: [birth year], 56 y.o.   MRN: [id number]  HPI The patient is here for annual wellness examination and management of other chronic and acute problems.   Some increased stress at home with difficulties with her daughter.    The risk factors are reflected in the social history.  The roster of all physicians providing medical care to patient - is listed in the Snapshot section of the chart.  Activities of daily living:  The patient is 100% inedpendent in all ADLs: dressing, toileting, feeding as well as independent mobility  Home safety : The patient has smoke detectors in the home. They wear seatbelts. No firearms at home. There is no violence in the home.   There is no risks for hepatitis, STDs or HIV. There is no   history of blood transfusion. They have no travel history to infectious disease endemic areas of the world.  The patient has seen their dentist in the last six month. They have seen their eye doctor in the last year. They deny any hearing difficulty and have not had audiologic testing in the last year.    They do not  have excessive sun exposure. Discussed the need for sun protection: hats, long sleeves and use of sunscreen if there is significant sun exposure.   Diet: the importance of a healthy diet is discussed. They do have a healthy diet.  The patient has no regular exercise program.  The benefits of regular aerobic exercise were discussed.  Depression screen: there are no signs or vegative symptoms of depression- irritability, change in appetite, anhedonia, sadness/tearfullness.  Cognitive assessment: the patient manages all their financial and personal affairs and is actively engaged.   The following portions of the patient's history were reviewed and updated as appropriate: allergies, current medications, past family history, past medical history,  past surgical history, past social history  and problem  list.  Past Medical History  Diagnosis Date  . External hemorrhoids   . History of breast cancer     Right Breast  . Other dyspnea and respiratory abnormality   . External thrombosed hemorrhoids   . Other postprocedural status   . Chronic back pain   . Allergic rhinitis   . History of carpal tunnel syndrome    Past Surgical History  Procedure Date  . Mastectomy,right   . Tubal ligation   . Hemorrhoid surgery   . Dilation and curettage of uterus    Family History  Problem Relation Age of Onset  . Cancer Father     lung  . Heart disease Maternal Grandfather   . Cancer Maternal Grandfather     prostate  . Cancer Paternal Uncle     Prostate cancer   History   Social History  . Marital Status: Married    Spouse Name: N/A    Number of Children: 2  . Years of Education: 16   Occupational History  . banking    Social History Main Topics  . Smoking status: Never Smoker   . Smokeless tobacco: Never Used  . Alcohol Use: Yes  . Drug Use: No  . Sexually Active: Yes -- Female partner(s)   Other Topics Concern  . 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 Nov '12 - tough year - SO w/ CVA,  dtr's ill, work with many changes and increased  stress. Jan '14 - Daughter Aundra Millet - home. She has had some issues and change in behavior. Stressful for both parents.    NO Rx meds. Ibuprofen as needed.    Vision, hearing, body mass index were assessed and reviewed.   During the course of the visit the patient was educated and counseled about appropriate screening and preventive services including : fall prevention , diabetes screening, nutrition counseling, colorectal cancer screening, and recommended immunizations.    Review of Systems Constitutional:  Negative for fever, chills, activity change and unexpected weight change.  HEENT:  Negative for hearing loss, ear pain, congestion, neck stiffness. Has dry  eyes and subsequent sinus problems. Negative for sore throat or swallowing problems. Negative for dental complaints.   Eyes: Negative for vision loss or change in visual acuity. Dry eyes. Respiratory: Negative for chest tightness and wheezing. Negative for DOE.   Cardiovascular: Negative for chest pain or palpitations. No decreased exercise tolerance Gastrointestinal: No change in bowel habit. No bloating or gas. No reflux or indigestion Genitourinary: Negative for urgency, frequency, flank pain and difficulty urinating.  Musculoskeletal: Negative for myalgias, back pain, arthralgias and gait problem.  Neurological: Negative for dizziness, tremors, weakness and headaches.  Hematological: Negative for adenopathy.  Psychiatric/Behavioral: Negative for behavioral problems and dysphoric mood.       Objective:   Physical Exam Filed Vitals:   02/20/12 0956  BP: 120/72  Pulse: 67  Temp: 99 F (37.2 C)  Resp: 10   Wt Readings from Last 3 Encounters:  02/20/12 167 lb 1.9 oz (75.805 kg)  12/28/10 169 lb (76.658 kg)  11/24/09 170 lb (77.111 kg)   Gen'l: well nourished, well developed AA Woman in no distress HEENT - [place]/AT, EACs/TMs normal, oropharynx with native dentition in good condition, no buccal or palatal lesions, posterior pharynx clear, mucous membranes moist. C&S clear, PERRLA, fundi - normal Neck - supple, no thyromegaly Nodes- negative submental, cervical, supraclavicular regions Chest - no deformity, no CVAT Lungs - clear without rales, wheezes. No increased work of breathing Breast - Deferred Cardiovascular - regular rate and rhythm, quiet precordium, no murmurs, rubs or gallops, 2+ radial, DP and PT pulses Abdomen - BS+ x 4, no HSM, no guarding or rebound or tenderness Pelvic - deferred to gyn Rectal - deferred to gyn Extremities - no clubbing, cyanosis, edema or deformity.  Neuro - A&O x 3, CN II-XII normal, motor strength normal and equal, DTRs 2+ and symmetrical biceps,  radial, and patellar tendons. Cerebellar - no tremor, no rigidity, fluid movement and normal gait. Derm - Head, neck, back, abdomen and extremities without suspicious lesions  Lab Results  Component Value Date   WBC 5.5 12/24/2010   HGB 12.1 02/20/2012   HCT 36.6 02/20/2012   PLT 301.0 12/24/2010   GLUCOSE 98 12/24/2010   CHOL 178 12/24/2010   TRIG 52.0 12/24/2010   HDL 53.00 12/24/2010   LDLDIRECT 156.1 11/12/2008   LDLCALC 115* 12/24/2010   ALT 15 12/24/2010   AST 17 12/24/2010   NA 140 12/24/2010   K 3.9 12/24/2010   CL 106 12/24/2010   CREATININE 0.7 12/24/2010   BUN 10 12/24/2010   CO2 26 12/24/2010   TSH 1.22 12/24/2010         Assessment & Plan:

## 2012-02-21 NOTE — Assessment & Plan Note (Signed)
Stable and doing well. Last diagnostic mammogram Sept 26, '13 was clear.  Plan - continue routine surveillance

## 2012-02-21 NOTE — Assessment & Plan Note (Signed)
Interval history negative for any major illness, surgery or injury. Limited physical, sans breast and pelvic, normal. Previous labs reviewed and are in normal range - no repeat indicated. Hemoglobin/Hematocrit in normal range today with no sign of anemia. She is current with colorectal cancer screening. Immunizations are up to date.  In summary - a very nice woman who appears to be medically stable. She will return in 1 year or sooner as needed.

## 2012-02-21 NOTE — Assessment & Plan Note (Signed)
LDL in '13 was better than goal of 130 or less and this has been true for several years. No indication for repeat lab this year.  Plan Continue healthy life-style/diet

## 2012-02-21 NOTE — Assessment & Plan Note (Signed)
She reports not using CPAP - mask materials cause facial rash. Denies excessive somnolence.   Plan - Consider repeat consult with Dr. Shelle Iron re: alternative delivery device(s) to facemask  Consider oral appliance as an alternative to CPAP for mild OSA (Dr. Althea Grimmer)

## 2012-02-22 ENCOUNTER — Encounter: Payer: Self-pay | Admitting: Internal Medicine

## 2012-10-04 ENCOUNTER — Encounter: Payer: Self-pay | Admitting: Internal Medicine

## 2012-10-04 ENCOUNTER — Ambulatory Visit (INDEPENDENT_AMBULATORY_CARE_PROVIDER_SITE_OTHER): Payer: Managed Care, Other (non HMO) | Admitting: Internal Medicine

## 2012-10-04 VITALS — BP 140/84 | HR 78 | Temp 99.3°F | Ht 62.0 in | Wt 179.0 lb

## 2012-10-04 DIAGNOSIS — M79644 Pain in right finger(s): Secondary | ICD-10-CM | POA: Insufficient documentation

## 2012-10-04 DIAGNOSIS — Z23 Encounter for immunization: Secondary | ICD-10-CM

## 2012-10-04 DIAGNOSIS — M79609 Pain in unspecified limb: Secondary | ICD-10-CM

## 2012-10-04 MED ORDER — DICLOFENAC SODIUM 2 % TD SOLN: 4.0000 "application " | Freq: Two times a day (BID) | TRANSDERMAL | Status: DC | PRN

## 2012-10-04 NOTE — Patient Instructions (Addendum)
You had the flu shot today Please take all new medication as prescribed - the Pennsaid soln (with the coupon card given today) Please continue all other medications as before, and refills have been done if requested. Please go to the XRAY Department in the Basement (go straight as you get off the elevator) for the x-ray testing You will be contacted by phone if any changes need to be made immediately.  Otherwise, you will receive a letter about your results with an explanation, but please check with MyChart first.   Please call in 1-2 wks if you would like referral to Hand Surgeon  Please remember to sign up for My Chart if you have not done so, as this will be important to you in the future with finding out test results, communicating by private email, and scheduling acute appointments online when needed.

## 2012-10-05 NOTE — Assessment & Plan Note (Addendum)
C/w right carpometacarpal joint strain vs DJD; for penssaid 2% topical nsaid tx prn, consider hand surgury referral if not improved for possible cortisone, avoid overuse at work if possible, for film as well r/o DJD

## 2012-10-05 NOTE — Progress Notes (Addendum)
  Subjective:    Patient ID: Chelsea Gordon, female    DOB: 26-Sep-1955, 57 y.o.   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  No prior hx.  Has also some clicking of the thumb distal joint as well but no signficant pain that wasn't present before as well.  No other finger, hand, wrist , arm pain.  No recent trauma, fever Past Medical History  Diagnosis Date  . External hemorrhoids   . History of breast cancer     Right Breast  . Other dyspnea and respiratory abnormality   . External thrombosed hemorrhoids   . Other postprocedural status(V45.89)   . Chronic back pain   . Allergic rhinitis   . History of carpal tunnel syndrome    Past Surgical History  Procedure Laterality Date  . Mastectomy,right    . Tubal ligation    . Hemorrhoid surgery    . Dilation and curettage of uterus      reports that she has never smoked. She has never used smokeless tobacco. She reports that  drinks alcohol. She reports that she does not use illicit drugs. family history includes Cancer in her father, maternal grandfather, and paternal uncle; Heart disease in her maternal grandfather. Not on File Current Outpatient Prescriptions on File Prior to Visit  Medication Sig Dispense Refill  . cyanocobalamin 100 MCG tablet Take 100 mcg by mouth daily.      Marland Kitchen ibuprofen (ADVIL,MOTRIN) 800 MG tablet Take 800 mg by mouth every 8 (eight) hours as needed.      . tetanus & diphtheria toxoids, adult, (TENIVAC) 5-2 LFU injection Inject 0.5 mLs into the muscle once.  0.5 mL  0   No current facility-administered medications on file prior to visit.   Review of Systems  All  otherwise neg per pt        Objective:   Physical Exam BP 140/84  Pulse 78  Temp(Src) 99.3 F (37.4 C) (Oral)  Ht 5\' 2"  (1.575 m)  Wt 179 lb (81.194 kg)  BMI 32.73 kg/m2  SpO2 99% VS noted,  Constitutional: Pt appears well-developed and well-nourished.  HENT: Head: NCAT.  Right Ear: External ear normal.  Left Ear: External ear normal.  Eyes: Conjunctivae and EOM are normal. Pupils are equal, round, and reactive to light.  Neck: Normal range of motion. Neck supple.  Cardiovascular: Normal rate and regular rhythm.   Pulmonary/Chest: Effort normal and breath sounds normal.  Right thumb MCP with mild bony enlargement and tenderness, minor soft tissue swelling but no redness, but FROM and no other tender to thumb, wrist, or first dorsal compartment, o/w FROM, thumb/finger apposition normal Neurological: Pt is alert. Not confused , bilat grip normal Skin: Skin is warm. No erythema.  Psychiatric: Pt behavior is normal. Thought content normal.     Assessment & Plan:

## 2012-10-08 ENCOUNTER — Ambulatory Visit (INDEPENDENT_AMBULATORY_CARE_PROVIDER_SITE_OTHER)
Admission: RE | Admit: 2012-10-08 | Discharge: 2012-10-08 | Disposition: A | Discharge status: Home / Self Care | Payer: Managed Care, Other (non HMO) | Source: Ambulatory Visit | Attending: Internal Medicine | Admitting: Internal Medicine

## 2012-10-08 DIAGNOSIS — M79644 Pain in right finger(s): Secondary | ICD-10-CM

## 2012-10-08 DIAGNOSIS — M79609 Pain in unspecified limb: Secondary | ICD-10-CM

## 2012-10-09 ENCOUNTER — Encounter: Payer: Self-pay | Admitting: Internal Medicine

## 2012-11-13 ENCOUNTER — Other Ambulatory Visit: Payer: Self-pay

## 2012-11-13 DIAGNOSIS — Z1231 Encounter for screening mammogram for malignant neoplasm of breast: Secondary | ICD-10-CM

## 2012-11-16 ENCOUNTER — Ambulatory Visit
Admission: RE | Admit: 2012-11-16 | Discharge: 2012-11-16 | Disposition: A | Discharge status: Home / Self Care | Payer: Managed Care, Other (non HMO) | Source: Ambulatory Visit

## 2012-11-16 DIAGNOSIS — Z1231 Encounter for screening mammogram for malignant neoplasm of breast: Secondary | ICD-10-CM

## 2012-12-20 ENCOUNTER — Other Ambulatory Visit (HOSPITAL_COMMUNITY)
Admission: RE | Admit: 2012-12-20 | Discharge: 2012-12-20 | Disposition: A | Discharge status: Home / Self Care | Payer: Managed Care, Other (non HMO) | Source: Ambulatory Visit | Attending: Obstetrics and Gynecology | Admitting: Obstetrics and Gynecology

## 2012-12-20 ENCOUNTER — Other Ambulatory Visit: Payer: Self-pay | Admitting: Obstetrics and Gynecology

## 2012-12-20 DIAGNOSIS — Z01419 Encounter for gynecological examination (general) (routine) without abnormal findings: Secondary | ICD-10-CM | POA: Insufficient documentation

## 2012-12-20 DIAGNOSIS — Z1151 Encounter for screening for human papillomavirus (HPV): Secondary | ICD-10-CM | POA: Insufficient documentation

## 2013-04-16 ENCOUNTER — Ambulatory Visit (INDEPENDENT_AMBULATORY_CARE_PROVIDER_SITE_OTHER): Payer: Managed Care, Other (non HMO) | Admitting: Internal Medicine

## 2013-04-16 ENCOUNTER — Encounter: Payer: Self-pay | Admitting: Internal Medicine

## 2013-04-16 ENCOUNTER — Ambulatory Visit
Admission: RE | Admit: 2013-04-16 | Discharge: 2013-04-16 | Disposition: A | Discharge status: Home / Self Care | Payer: Managed Care, Other (non HMO) | Source: Ambulatory Visit | Attending: Internal Medicine | Admitting: Internal Medicine

## 2013-04-16 VITALS — BP 118/84 | HR 68 | Temp 98.0°F | Ht 63.5 in | Wt 171.0 lb

## 2013-04-16 DIAGNOSIS — Z23 Encounter for immunization: Secondary | ICD-10-CM

## 2013-04-16 DIAGNOSIS — Z9189 Other specified personal risk factors, not elsewhere classified: Secondary | ICD-10-CM

## 2013-04-16 DIAGNOSIS — E785 Hyperlipidemia, unspecified: Secondary | ICD-10-CM

## 2013-04-16 DIAGNOSIS — Z0000 Encounter for general adult medical examination without abnormal findings: Secondary | ICD-10-CM

## 2013-04-16 DIAGNOSIS — C50919 Malignant neoplasm of unspecified site of unspecified female breast: Secondary | ICD-10-CM

## 2013-04-16 DIAGNOSIS — Z789 Other specified health status: Secondary | ICD-10-CM

## 2013-04-16 MED ORDER — IBUPROFEN 800 MG PO TABS: 800.0000 mg | ORAL_TABLET | Freq: Three times a day (TID) | ORAL | Status: DC | PRN

## 2013-04-16 NOTE — Assessment & Plan Note (Signed)
Durable remission. Current with follow up mammography

## 2013-04-16 NOTE — Patient Instructions (Signed)
Good to see you and you appear to be doing well.   The arthritis is common: it is important to do ROM - go to youtube to find good exercises for your hands. It is also good to use heat and a rub of choice over the involved joinbts. Ibuprofen is also OK  Health maintenance - we will schedule a bone density study. You are current with colorectal cancer screening and will be due 2018. Immunizations current and we will give you Prevnar today. Please keep up with exercise: especially flex/stretch. You are current with labs including lipid panel and this should be repeated in Nov '16.   It has been an honor for me to help you with your medical care. Moving forward you will get great care from Dr. Vivien Rossetti or Lemar Livings NP at the [place] office.

## 2013-04-16 NOTE — Progress Notes (Signed)
Pre visit review using our clinic review tool, if applicable. No additional management support is needed unless otherwise documented below in the visit note. 

## 2013-04-16 NOTE — Progress Notes (Signed)
Subjective:    Patient ID: Chelsea Gordon, female    DOB: 1955/03/27, 58 y.o.   MRN: [id number]  HPI Chelsea Gordon presents for general medical exam. She has had a good year w/o major medical illness except for overuse arthritic changes of both thumbs that responds to rubs and NSAIDs. She is current with Gyn, last eye exam in the last 24 months, current with dental care. Had extractions last year.   Healthy diet; exercising an a regular basis-completed 30 day challenge. Work is what it is - manageable. Chelsea has graduated from [employer] and is in grad school in the [place].   Past Medical History  Diagnosis Date  . External hemorrhoids   . History of breast cancer     Right Breast  . Other dyspnea and respiratory abnormality   . External thrombosed hemorrhoids   . Other postprocedural status(V45.89)   . Chronic back pain   . Allergic rhinitis   . History of carpal tunnel syndrome    Past Surgical History  Procedure Laterality Date  . Mastectomy,right    . Tubal ligation    . Hemorrhoid surgery    . Dilation and curettage of uterus     Family History  Problem Relation Age of Onset  . Cancer Chelsea Gordon     lung  . Heart disease Chelsea Gordon   . Cancer Chelsea Gordon     prostate  . Cancer Chelsea Gordon     Prostate cancer   History   Social History  . Marital Status: Married    Spouse Name: N/A    Number of Children: 2  . Years of Education: 16   Occupational History  . banking    Social History Main Topics  . Smoking status: Never Smoker   . Smokeless tobacco: Never Used  . Alcohol Use: Yes  . Drug Use: No  . Sexual Activity: Yes    Partners: Male   Other Topics Concern  . 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 Nov '12 - tough year - SO w/ CVA,  dtr's ill, work with many changes and increased stress. Jan '14 - Chelsea Gordon - home. She has had some issues and  change in behavior. Stressful for both parents.    Current Outpatient Prescriptions on File Prior to Visit  Medication Sig Dispense Refill  . ibuprofen (ADVIL,MOTRIN) 800 MG tablet Take 800 mg by mouth every 8 (eight) hours as needed.       No current facility-administered medications on file prior to visit.     Review of Systems Constitutional:  Negative for fever, chills, activity change and unexpected weight change.  HEENT:  Negative for hearing loss, ear pain, congestion, neck stiffness and postnasal drip. Negative for sore throat or swallowing problems. Positive for dental complaints - has had extractions and will be needing some work in the future.   Eyes: Negative for vision loss or change in visual acuity.  Respiratory: Negative for chest tightness and wheezing. Negative for DOE.   Cardiovascular: Negative for chest pain or palpitations. No decreased exercise tolerance Gastrointestinal: No change in bowel habit. No bloating or gas. No reflux or indigestion. Has chronic constipation. Genitourinary: Negative for urgency, frequency, flank pain and difficulty urinating.  Musculoskeletal: Negative for myalgias, back pain, and gait problem. Arthralgias thumbs and knees.  Neurological: Negative for dizziness, tremors, weakness and headaches.  Hematological: Negative for adenopathy.  Psychiatric/Behavioral: Negative for behavioral problems and dysphoric mood.   Outside labs with LDL 131, HDL 65, Bmet normal, iron stores normal (11/'14)    Objective:   Physical Exam Filed Vitals:   04/16/13 0931  BP: 118/84  Pulse: 68  Temp: 98 F (36.7 C)   Wt Readings from Last 3 Encounters:  04/16/13 171 lb (77.565 kg)  10/04/12 179 lb (81.194 kg)  02/20/12 167 lb 1.9 oz (75.805 kg)   Gen'l: well nourished, well developed, overweight Woman in no distress HEENT - Speed/AT, EACs/TMs normal, oropharynx with native dentition in good condition, no buccal or palatal lesions, posterior pharynx clear,  mucous membranes moist. C&S clear, PERRLA, fundi - normal Neck - supple, no thyromegaly Nodes- negative submental, cervical, supraclavicular regions Chest - no deformity, no CVAT Lungs - clear without rales, wheezes. No increased work of breathing Breast - deferred to oncology and mammography Cardiovascular - regular rate and rhythm, quiet precordium, no murmurs, rubs or gallops, 2+ radial, DP and PT pulses Abdomen - BS+ x 4, no HSM, no guarding or rebound or tenderness Pelvic - deferred to gyn Rectal - deferred to gyn Extremities - no clubbing, cyanosis, edema or deformity.  Neuro - A&O x 3, CN II-XII normal, motor strength normal and equal, DTRs 2+ and symmetrical biceps, radial, and patellar tendons. Cerebellar - no tremor, no rigidity, fluid movement and normal gait. Derm - Head, neck, back, abdomen and extremities without suspicious lesions  Reviewed labs from Nov 2014        Assessment & Plan:

## 2013-04-16 NOTE — Assessment & Plan Note (Signed)
Outside lab Nov '14: TC 198/HDL 55/ LDL 131/ Tgy 60 Normal values  Plan Prudent, heart healthy diet.

## 2013-04-16 NOTE — Assessment & Plan Note (Signed)
Interval history w/o major medical problems. Limited physical exam, sans breast (oncology) and pelvic, is normal. She is current with colorectal cancer screening. Reviewed Labs from Gyn Nov '14: normal lipids, Cmet, PAP, iron. Immunizations are up to date with prevnar given today.  In summary - a very nice woman who appears medically stable.

## 2013-04-17 ENCOUNTER — Telehealth: Payer: Self-pay | Admitting: Internal Medicine

## 2013-04-17 NOTE — Telephone Encounter (Signed)
Ok per Dr. Norins and Dr. Blyth to transfer care to Dr. Blyth ° °

## 2013-08-23 ENCOUNTER — Other Ambulatory Visit: Payer: Self-pay

## 2013-08-23 DIAGNOSIS — Z1231 Encounter for screening mammogram for malignant neoplasm of breast: Secondary | ICD-10-CM

## 2013-09-10 ENCOUNTER — Telehealth: Payer: Self-pay | Admitting: Family Medicine

## 2013-09-10 NOTE — Telephone Encounter (Signed)
Received medical records from [facility]

## 2013-11-18 LAB — HM MAMMOGRAPHY: HM MAMMO: NEGATIVE

## 2013-11-19 ENCOUNTER — Encounter: Payer: Self-pay | Admitting: Internal Medicine

## 2013-11-20 ENCOUNTER — Ambulatory Visit: Payer: Managed Care, Other (non HMO)

## 2013-11-22 ENCOUNTER — Encounter: Payer: Self-pay | Admitting: Obstetrics and Gynecology

## 2013-11-29 ENCOUNTER — Encounter: Payer: Self-pay | Admitting: Family Medicine

## 2013-11-29 ENCOUNTER — Ambulatory Visit (INDEPENDENT_AMBULATORY_CARE_PROVIDER_SITE_OTHER): Payer: Managed Care, Other (non HMO) | Admitting: Family Medicine

## 2013-11-29 VITALS — BP 132/70 | HR 65 | Temp 98.6°F | Ht 65.0 in | Wt 175.2 lb

## 2013-11-29 DIAGNOSIS — Z23 Encounter for immunization: Secondary | ICD-10-CM

## 2013-11-29 DIAGNOSIS — E785 Hyperlipidemia, unspecified: Secondary | ICD-10-CM

## 2013-11-29 DIAGNOSIS — M1990 Unspecified osteoarthritis, unspecified site: Secondary | ICD-10-CM

## 2013-11-29 DIAGNOSIS — M79644 Pain in right finger(s): Secondary | ICD-10-CM

## 2013-11-29 DIAGNOSIS — K645 Perianal venous thrombosis: Secondary | ICD-10-CM

## 2013-11-29 DIAGNOSIS — R739 Hyperglycemia, unspecified: Secondary | ICD-10-CM

## 2013-11-29 DIAGNOSIS — B019 Varicella without complication: Secondary | ICD-10-CM | POA: Insufficient documentation

## 2013-11-29 DIAGNOSIS — E663 Overweight: Secondary | ICD-10-CM | POA: Insufficient documentation

## 2013-11-29 DIAGNOSIS — G4733 Obstructive sleep apnea (adult) (pediatric): Secondary | ICD-10-CM

## 2013-11-29 DIAGNOSIS — D649 Anemia, unspecified: Secondary | ICD-10-CM

## 2013-11-29 HISTORY — DX: Anemia, unspecified: D64.9

## 2013-11-29 HISTORY — DX: Overweight: E66.3

## 2013-11-29 LAB — RENAL FUNCTION PANEL
Albumin: 3.6 g/dL (ref 3.5–5.2)
BUN: 11 mg/dL (ref 6–23)
CO2: 23 meq/L (ref 19–32)
CREATININE: 0.7 mg/dL (ref 0.4–1.2)
Calcium: 9.4 mg/dL (ref 8.4–10.5)
Chloride: 108 mEq/L (ref 96–112)
GFR: 116.66 mL/min (ref >60.00)
GLUCOSE: 77 mg/dL (ref 70–99)
Phosphorus: 3.6 mg/dL (ref 2.3–4.6)
Potassium: 3.7 mEq/L (ref 3.5–5.1)
SODIUM: 142 meq/L (ref 135–145)

## 2013-11-29 LAB — HEPATIC FUNCTION PANEL
ALBUMIN: 3.6 g/dL (ref 3.5–5.2)
ALT: 11 U/L (ref 0–35)
AST: 17 U/L (ref 0–37)
Alkaline Phosphatase: 81 U/L (ref 39–117)
Bilirubin, Direct: 0 mg/dL (ref 0.0–0.3)
Total Bilirubin: 0.7 mg/dL (ref 0.2–1.2)
Total Protein: 7.7 g/dL (ref 6.0–8.3)

## 2013-11-29 LAB — CBC
HEMATOCRIT: 38.7 % (ref 36.0–46.0)
HEMOGLOBIN: 12.5 g/dL (ref 12.0–15.0)
MCHC: 32.2 g/dL (ref 30.0–36.0)
MCV: 87 fl (ref 78.0–100.0)
Platelets: 272 10*3/uL (ref 150.0–400.0)
RBC: 4.45 Mil/uL (ref 3.87–5.11)
RDW: 13.7 % (ref 11.5–15.5)
WBC: 4.7 10*3/uL (ref 4.0–10.5)

## 2013-11-29 LAB — LIPID PANEL
CHOLESTEROL: 200 mg/dL (ref 0–200)
HDL: 55.4 mg/dL (ref >39.00)
LDL Cholesterol: 131 mg/dL — ABNORMAL HIGH (ref 0–99)
NONHDL: 144.6
Total CHOL/HDL Ratio: 4
Triglycerides: 68 mg/dL (ref 0.0–149.0)
VLDL: 13.6 mg/dL (ref 0.0–40.0)

## 2013-11-29 LAB — TSH: TSH: 0.81 u[IU]/mL (ref 0.35–4.50)

## 2013-11-29 LAB — HEMOGLOBIN A1C: HEMOGLOBIN A1C: 6 % (ref 4.6–6.5)

## 2013-11-29 MED ORDER — IBUPROFEN 800 MG PO TABS: 400.0000 mg | ORAL_TABLET | Freq: Three times a day (TID) | ORAL | Status: DC | PRN

## 2013-11-29 NOTE — Progress Notes (Signed)
Pre visit review using our clinic review tool, if applicable. No additional management support is needed unless otherwise documented below in the visit note. 

## 2013-11-29 NOTE — Progress Notes (Signed)
Patient ID: Chelsea Gordon, female   DOB: 08/25/1955, 58 y.o.   MRN: [id number] Chelsea Gordon [id number] 1956-01-21 11/29/2013      Progress Note-Follow Up  Subjective  Chief Complaint  Chief Complaint  Patient presents with  . Establish Care    new patient  . Injections    flu    HPI  Patient is a 58 year old female in today for routine medical care. She is in today for new patient appt. C/O a cyst on her back which is mildly painful, has had a white head on it. No recent illness, fevers, chills, myalgias or malaise. She also notes some low back pain and right hip pain recently. Denies any injury and is actually better today. No incontinence. Is also noting some mild congestion and PND with intermittent sense of numbness over her right cheek for past week. No HA/ear pain/sore throat. Denies CP/palp/SOB/HA/fevers/GI or GU c/o. Taking meds as prescribed. Follows with GYN and saw them recently after an episode of post menopausal bleeding. Her work up was negative and she has had no further bleeding. Finally she is complaining of thumb pain on right. No polyuria or polydipsia.  Past Medical History  Diagnosis Date  . External hemorrhoids   . Other dyspnea and respiratory abnormality   . External thrombosed hemorrhoids   . Other postprocedural status(V45.89)   . Chronic back pain   . Allergic rhinitis   . History of carpal tunnel syndrome   . Chicken pox as a child  . Anemia 11/29/2013  . Overweight 11/29/2013    Past Surgical History  Procedure Laterality Date  . Mastectomy,right    . Tubal ligation    . Hemorrhoid surgery    . Dilation and curettage of uterus      Family History  Problem Relation Age of Onset  . Cancer Father     lung- smoke  . Heart disease Maternal Grandfather   . Cancer Maternal Grandfather     prostate  . Cancer Paternal Uncle     Prostate cancer  . Diabetes Mother     type 2  . Sarcoidosis Mother     in sinuses  . Hepatitis Sister   .  Diabetes Brother   . Colitis Daughter 25    alcerative  . Anemia Daughter     History   Social History  . Marital Status: Married    Spouse Name: N/A    Number of Children: 2  . Years of Education: 16   Occupational History  . banking    Social History Main Topics  . Smoking status: Never Smoker   . Smokeless tobacco: Never Used  . Alcohol Use: Yes  . Drug Use: No  . Sexual Activity: Yes    Partners: Male   Other Topics Concern  . 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 Nov '12 - tough year - SO w/ CVA,  dtr's ill, work with many changes and increased stress. Jan '14 - Daughter Jinny Blossom - home. She has had some issues and change in behavior. Stressful for both parents.    Current Outpatient Prescriptions on File Prior to Visit  Medication Sig Dispense Refill  . ibuprofen (ADVIL,MOTRIN) 800 MG tablet Take 1 tablet (800 mg total) by mouth every 8 (eight) hours as needed.  30 tablet  11   No current facility-administered medications on file prior to visit.  No Known Allergies  Review of Systems  Review of Systems  Constitutional: Negative for fever and malaise/fatigue.  HENT: Negative for congestion.   Eyes: Negative for discharge.  Respiratory: Negative for shortness of breath.   Cardiovascular: Negative for chest pain, palpitations and leg swelling.  Gastrointestinal: Negative for nausea, abdominal pain and diarrhea.  Genitourinary: Negative for dysuria.  Musculoskeletal: Negative for falls.  Skin: Negative for rash.  Neurological: Negative for loss of consciousness and headaches.  Endo/Heme/Allergies: Negative for polydipsia.  Psychiatric/Behavioral: Negative for depression and suicidal ideas. The patient is not nervous/anxious and does not have insomnia.     Objective  BP 132/70  Pulse 65  Temp(Src) 98.6 F (37 C) (Oral)  Ht 5\' 5"  (1.651 m)  Wt 175 lb 3.2 oz (79.47 kg)   BMI 29.15 kg/m2  SpO2 100%  LMP 02/21/2012  Physical Exam  Physical Exam  Constitutional: She is oriented to person, place, and time and well-developed, well-nourished, and in no distress. No distress.  HENT:  Head: Normocephalic and atraumatic.  Right Ear: External ear normal.  Left Ear: External ear normal.  Nose: Nose normal.  Mouth/Throat: Oropharynx is clear and moist. No oropharyngeal exudate.  Eyes: Conjunctivae are normal. Pupils are equal, round, and reactive to light. Right eye exhibits no discharge. Left eye exhibits no discharge. No scleral icterus.  Neck: Normal range of motion. Neck supple. No thyromegaly present.  Cardiovascular: Normal rate, regular rhythm, normal heart sounds and intact distal pulses.   No murmur heard. Pulmonary/Chest: Effort normal and breath sounds normal. No respiratory distress. She has no wheezes. She has no rales.  Abdominal: Soft. Bowel sounds are normal. She exhibits no distension and no mass. There is no tenderness.  Musculoskeletal: Normal range of motion. She exhibits no edema and no tenderness.  Lymphadenopathy:    She has no cervical adenopathy.  Neurological: She is alert and oriented to person, place, and time. She has normal reflexes. No cranial nerve deficit. Coordination normal.  Skin: Skin is warm and dry. No rash noted. She is not diaphoretic.  Psychiatric: Mood, memory and affect normal.    Lab Results  Component Value Date   TSH 1.22 12/24/2010   Lab Results  Component Value Date   WBC 5.5 12/24/2010   HGB 12.1 02/20/2012   HCT 36.6 02/20/2012   MCV 88.3 12/24/2010   PLT 301.0 12/24/2010   Lab Results  Component Value Date   CREATININE 0.7 12/24/2010   BUN 10 12/24/2010   NA 140 12/24/2010   K 3.9 12/24/2010   CL 106 12/24/2010   CO2 26 12/24/2010   Lab Results  Component Value Date   ALT 15 12/24/2010   AST 17 12/24/2010   ALKPHOS 58 12/24/2010   BILITOT 0.7 12/24/2010   Lab Results  Component Value Date    CHOL 178 12/24/2010   Lab Results  Component Value Date   HDL 53.00 12/24/2010   Lab Results  Component Value Date   LDLCALC 115* 12/24/2010   Lab Results  Component Value Date   TRIG 52.0 12/24/2010   Lab Results  Component Value Date   CHOLHDL 3 12/24/2010     Assessment & Plan  Overweight Encouraged DASH diet, decrease po intake and increase exercise as tolerated. Needs 7-8 hours of sleep nightly. Avoid trans fats, eat small, frequent meals every 4-5 hours with lean proteins, complex carbs and healthy fats. Minimize simple carbs, GMO foods.  Obstructive sleep apnea Using CPAP intermittently, encouraged to resetart consistent  use. Mask is uncomfortable. Needs full face mask. Encouraged to restart and she agrees  EXTERNAL HEMORRHOIDS, THROMBOSED Excised, encouraged probiotics, fiber and fluids, asymptomatic at this time  Anemia Increase leafy greens, consider increased lean red meat and using cast iron cookware. Continue to monitor, report any concerns, resolved  Hyperlipidemia Encouraged heart healthy diet, increase exercise, avoid trans fats, consider a krill oil cap daily  Pain of right thumb Encouraged to take no more than 400 mg of Ibuprofen at a time. May apply topical treatments such as Salon Pas prn.  Hyperglycemia [id number] acceptable, minimize simple carbs. Increase exercise as tolerated.

## 2013-11-29 NOTE — Patient Instructions (Addendum)
Probiotics, such as Digestive Advantage or [facility] Fiber such as Benefiber once to twice with 64 oz of fluids, Metamucil Salon Pas Gel and Curcumen capsules, Luckyvitamins.[place] for Adults A healthy lifestyle and preventive care can promote health and wellness. Preventive health guidelines for women include the following key practices.  A routine yearly physical is a good way to check with your health care provider about your health and preventive screening. It is a chance to share any concerns and updates on your health and to receive a thorough exam.  Visit your dentist for a routine exam and preventive care every 6 months. Brush your teeth twice a day and floss once a day. Good oral hygiene prevents tooth decay and gum disease.  The frequency of eye exams is based on your age, health, family medical history, use of contact lenses, and other factors. Follow your health care provider's recommendations for frequency of eye exams.  Eat a healthy diet. Foods like vegetables, fruits, whole grains, low-fat dairy products, and lean protein foods contain the nutrients you need without too many calories. Decrease your intake of foods high in solid fats, added sugars, and salt. Eat the right amount of calories for you.Get information about a proper diet from your health care provider, if necessary.  Regular physical exercise is one of the most important things you can do for your health. Most adults should get at least 150 minutes of moderate-intensity exercise (any activity that increases your heart rate and causes you to sweat) each week. In addition, most adults need muscle-strengthening exercises on 2 or more days a week.  Maintain a healthy weight. The body mass index (BMI) is a screening tool to identify possible weight problems. It provides an estimate of body fat based on height and weight. Your health care provider can find your BMI and can help you  achieve or maintain a healthy weight.For adults 20 years and older:  A BMI below 18.5 is considered underweight.  A BMI of 18.5 to 24.9 is normal.  A BMI of 25 to 29.9 is considered overweight.  A BMI of 30 and above is considered obese.  Maintain normal blood lipids and cholesterol levels by exercising and minimizing your intake of saturated fat. Eat a balanced diet with plenty of fruit and vegetables. Blood tests for lipids and cholesterol should begin at age 27 and be repeated every 5 years. If your lipid or cholesterol levels are high, you are over 50, or you are at high risk for heart disease, you may need your cholesterol levels checked more frequently.Ongoing high lipid and cholesterol levels should be treated with medicines if diet and exercise are not working.  If you smoke, find out from your health care provider how to quit. If you do not use tobacco, do not start.  Lung cancer screening is recommended for adults aged 30-80 years who are at high risk for developing lung cancer because of a history of smoking. A yearly low-dose CT scan of the lungs is recommended for people who have at least a 30-pack-year history of smoking and are a current smoker or have quit within the past 15 years. A pack year of smoking is smoking an average of 1 pack of cigarettes a day for 1 year (for example: 1 pack a day for 30 years or 2 packs a day for 15 years). Yearly screening should continue until the smoker has stopped smoking for at least 15 years.  Yearly screening should be stopped for people who develop a health problem that would prevent them from having lung cancer treatment.  If you are pregnant, do not drink alcohol. If you are breastfeeding, be very cautious about drinking alcohol. If you are not pregnant and choose to drink alcohol, do not have more than 1 drink per day. One drink is considered to be 12 ounces (355 mL) of beer, 5 ounces (148 mL) of wine, or 1.5 ounces (44 mL) of liquor.  Avoid  use of street drugs. Do not share needles with anyone. Ask for help if you need support or instructions about stopping the use of drugs.  High blood pressure causes heart disease and increases the risk of stroke. Your blood pressure should be checked at least every 1 to 2 years. Ongoing high blood pressure should be treated with medicines if weight loss and exercise do not work.  If you are 76-28 years old, ask your health care provider if you should take aspirin to prevent strokes.  Diabetes screening involves taking a blood sample to check your fasting blood sugar level. This should be done once every 3 years, after age 55, if you are within normal weight and without risk factors for diabetes. Testing should be considered at a younger age or be carried out more frequently if you are overweight and have at least 1 risk factor for diabetes.  Breast cancer screening is essential preventive care for women. You should practice "breast self-awareness." This means understanding the normal appearance and feel of your breasts and may include breast self-examination. Any changes detected, no matter how small, should be reported to a health care provider. Women in their 81s and 30s should have a clinical breast exam (CBE) by a health care provider as part of a regular health exam every 1 to 3 years. After age 77, women should have a CBE every year. Starting at age 47, women should consider having a mammogram (breast X-ray test) every year. Women who have a family history of breast cancer should talk to their health care provider about genetic screening. Women at a high risk of breast cancer should talk to their health care providers about having an MRI and a mammogram every year.  Breast cancer gene (BRCA)-related cancer risk assessment is recommended for women who have family members with BRCA-related cancers. BRCA-related cancers include breast, ovarian, tubal, and peritoneal cancers. Having family members with  these cancers may be associated with an increased risk for harmful changes (mutations) in the breast cancer genes BRCA1 and BRCA2. Results of the assessment will determine the need for genetic counseling and BRCA1 and BRCA2 testing.  Routine pelvic exams to screen for cancer are no longer recommended for nonpregnant women who are considered low risk for cancer of the pelvic organs (ovaries, uterus, and vagina) and who do not have symptoms. Ask your health care provider if a screening pelvic exam is right for you.  If you have had past treatment for cervical cancer or a condition that could lead to cancer, you need Pap tests and screening for cancer for at least 20 years after your treatment. If Pap tests have been discontinued, your risk factors (such as having a new sexual partner) need to be reassessed to determine if screening should be resumed. Some women have medical problems that increase the chance of getting cervical cancer. In these cases, your health care provider may recommend more frequent screening and Pap tests.  The HPV test is an additional  test that may be used for cervical cancer screening. The HPV test looks for the virus that can cause the cell changes on the cervix. The cells collected during the Pap test can be tested for HPV. The HPV test could be used to screen women aged 58 years and older, and should be used in women of any age who have unclear Pap test results. After the age of 66, women should have HPV testing at the same frequency as a Pap test.  Colorectal cancer can be detected and often prevented. Most routine colorectal cancer screening begins at the age of 66 years and continues through age 50 years. However, your health care provider may recommend screening at an earlier age if you have risk factors for colon cancer. On a yearly basis, your health care provider may provide home test kits to check for hidden blood in the stool. Use of a small camera at the end of a tube, to  directly examine the colon (sigmoidoscopy or colonoscopy), can detect the earliest forms of colorectal cancer. Talk to your health care provider about this at age 64, when routine screening begins. Direct exam of the colon should be repeated every 5-10 years through age 72 years, unless early forms of pre-cancerous polyps or small growths are found.  People who are at an increased risk for hepatitis B should be screened for this virus. You are considered at high risk for hepatitis B if:  You were born in a country where hepatitis B occurs often. Talk with your health care provider about which countries are considered high risk.  Your parents were born in a high-risk country and you have not received a shot to protect against hepatitis B (hepatitis B vaccine).  You have HIV or AIDS.  You use needles to inject street drugs.  You live with, or have sex with, someone who has hepatitis B.  You get hemodialysis treatment.  You take certain medicines for conditions like cancer, organ transplantation, and autoimmune conditions.  Hepatitis C blood testing is recommended for all people born from [birth year] through 1965 and any individual with known risks for hepatitis C.  Practice safe sex. Use condoms and avoid high-risk sexual practices to reduce the spread of sexually transmitted infections (STIs). STIs include gonorrhea, chlamydia, syphilis, trichomonas, herpes, HPV, and human immunodeficiency virus (HIV). Herpes, HIV, and HPV are viral illnesses that have no cure. They can result in disability, cancer, and death.  You should be screened for sexually transmitted illnesses (STIs) including gonorrhea and chlamydia if:  You are sexually active and are younger than 24 years.  You are older than 24 years and your health care provider tells you that you are at risk for this type of infection.  Your sexual activity has changed since you were last screened and you are at an increased risk for chlamydia or  gonorrhea. Ask your health care provider if you are at risk.  If you are at risk of being infected with HIV, it is recommended that you take a prescription medicine daily to prevent HIV infection. This is called preexposure prophylaxis (PrEP). You are considered at risk if:  You are a heterosexual woman, are sexually active, and are at increased risk for HIV infection.  You take drugs by injection.  You are sexually active with a partner who has HIV.  Talk with your health care provider about whether you are at high risk of being infected with HIV. If you choose to begin PrEP, you should  first be tested for HIV. You should then be tested every 3 months for as long as you are taking PrEP.  Osteoporosis is a disease in which the bones lose minerals and strength with aging. This can result in serious bone fractures or breaks. The risk of osteoporosis can be identified using a bone density scan. Women ages 33 years and over and women at risk for fractures or osteoporosis should discuss screening with their health care providers. Ask your health care provider whether you should take a calcium supplement or vitamin D to reduce the rate of osteoporosis.  Menopause can be associated with physical symptoms and risks. Hormone replacement therapy is available to decrease symptoms and risks. You should talk to your health care provider about whether hormone replacement therapy is right for you.  Use sunscreen. Apply sunscreen liberally and repeatedly throughout the day. You should seek shade when your shadow is shorter than you. Protect yourself by wearing long sleeves, pants, a wide-brimmed hat, and sunglasses year round, whenever you are outdoors.  Once a month, do a whole body skin exam, using a mirror to look at the skin on your back. Tell your health care provider of new moles, moles that have irregular borders, moles that are larger than a pencil eraser, or moles that have changed in shape or  color.  Stay current with required vaccines (immunizations).  Influenza vaccine. All adults should be immunized every year.  Tetanus, diphtheria, and acellular pertussis (Td, Tdap) vaccine. Pregnant women should receive 1 dose of Tdap vaccine during each pregnancy. The dose should be obtained regardless of the length of time since the last dose. Immunization is preferred during the 27th-36th week of gestation. An adult who has not previously received Tdap or who does not know her vaccine status should receive 1 dose of Tdap. This initial dose should be followed by tetanus and diphtheria toxoids (Td) booster doses every 10 years. Adults with an unknown or incomplete history of completing a 3-dose immunization series with Td-containing vaccines should begin or complete a primary immunization series including a Tdap dose. Adults should receive a Td booster every 10 years.  Varicella vaccine. An adult without evidence of immunity to varicella should receive 2 doses or a second dose if she has previously received 1 dose. Pregnant females who do not have evidence of immunity should receive the first dose after pregnancy. This first dose should be obtained before leaving the health care facility. The second dose should be obtained 4-8 weeks after the first dose.  Human papillomavirus (HPV) vaccine. Females aged 13-26 years who have not received the vaccine previously should obtain the 3-dose series. The vaccine is not recommended for use in pregnant females. However, pregnancy testing is not needed before receiving a dose. If a female is found to be pregnant after receiving a dose, no treatment is needed. In that case, the remaining doses should be delayed until after the pregnancy. Immunization is recommended for any person with an immunocompromised condition through the age of 34 years if she did not get any or all doses earlier. During the 3-dose series, the second dose should be obtained 4-8 weeks after the  first dose. The third dose should be obtained 24 weeks after the first dose and 16 weeks after the second dose.  Zoster vaccine. One dose is recommended for adults aged 57 years or older unless certain conditions are present.  Measles, mumps, and rubella (MMR) vaccine. Adults born before [birth year] generally are considered immune to  measles and mumps. Adults born in [birth year] or later should have 1 or more doses of MMR vaccine unless there is a contraindication to the vaccine or there is laboratory evidence of immunity to each of the three diseases. A routine second dose of MMR vaccine should be obtained at least 28 days after the first dose for students attending postsecondary schools, health care workers, or international travelers. People who received inactivated measles vaccine or an unknown type of measles vaccine during 1963-1967 should receive 2 doses of MMR vaccine. People who received inactivated mumps vaccine or an unknown type of mumps vaccine before 1979 and are at high risk for mumps infection should consider immunization with 2 doses of MMR vaccine. For females of childbearing age, rubella immunity should be determined. If there is no evidence of immunity, females who are not pregnant should be vaccinated. If there is no evidence of immunity, females who are pregnant should delay immunization until after pregnancy. Unvaccinated health care workers born before [birth year] who lack laboratory evidence of measles, mumps, or rubella immunity or laboratory confirmation of disease should consider measles and mumps immunization with 2 doses of MMR vaccine or rubella immunization with 1 dose of MMR vaccine.  Pneumococcal 13-valent conjugate (PCV13) vaccine. When indicated, a person who is uncertain of her immunization history and has no record of immunization should receive the PCV13 vaccine. An adult aged 4 years or older who has certain medical conditions and has not been previously immunized should receive 1 dose of  PCV13 vaccine. This PCV13 should be followed with a dose of pneumococcal polysaccharide (PPSV23) vaccine. The PPSV23 vaccine dose should be obtained at least 8 weeks after the dose of PCV13 vaccine. An adult aged 5 years or older who has certain medical conditions and previously received 1 or more doses of PPSV23 vaccine should receive 1 dose of PCV13. The PCV13 vaccine dose should be obtained 1 or more years after the last PPSV23 vaccine dose.  Pneumococcal polysaccharide (PPSV23) vaccine. When PCV13 is also indicated, PCV13 should be obtained first. All adults aged 75 years and older should be immunized. An adult younger than age 65 years who has certain medical conditions should be immunized. Any person who resides in a nursing home or long-term care facility should be immunized. An adult smoker should be immunized. People with an immunocompromised condition and certain other conditions should receive both PCV13 and PPSV23 vaccines. People with human immunodeficiency virus (HIV) infection should be immunized as soon as possible after diagnosis. Immunization during chemotherapy or radiation therapy should be avoided. Routine use of PPSV23 vaccine is not recommended for American Indians, [place] Natives, or people younger than 65 years unless there are medical conditions that require PPSV23 vaccine. When indicated, people who have unknown immunization and have no record of immunization should receive PPSV23 vaccine. One-time revaccination 5 years after the first dose of PPSV23 is recommended for people aged 19-64 years who have chronic kidney failure, nephrotic syndrome, asplenia, or immunocompromised conditions. People who received 1-2 doses of PPSV23 before age 64 years should receive another dose of PPSV23 vaccine at age 57 years or later if at least 5 years have passed since the previous dose. Doses of PPSV23 are not needed for people immunized with PPSV23 at or after age 57 years.  Meningococcal vaccine.  Adults with asplenia or persistent complement component deficiencies should receive 2 doses of quadrivalent meningococcal conjugate (MenACWY-D) vaccine. The doses should be obtained at least 2 months apart. Microbiologists working with certain meningococcal  bacteria, [employer] recruits, people at risk during an outbreak, and people who travel to or live in countries with a high rate of meningitis should be immunized. A first-year college student up through age 29 years who is living in a residence hall should receive a dose if she did not receive a dose on or after her 16th birthday. Adults who have certain high-risk conditions should receive one or more doses of vaccine.  Hepatitis A vaccine. Adults who wish to be protected from this disease, have certain high-risk conditions, work with hepatitis A-infected animals, work in hepatitis A research labs, or travel to or work in countries with a high rate of hepatitis A should be immunized. Adults who were previously unvaccinated and who anticipate close contact with an international adoptee during the first 60 days after arrival in the [place] from a country with a high rate of hepatitis A should be immunized.  Hepatitis B vaccine. Adults who wish to be protected from this disease, have certain high-risk conditions, may be exposed to blood or other infectious body fluids, are household contacts or sex partners of hepatitis B positive people, are clients or workers in certain care facilities, or travel to or work in countries with a high rate of hepatitis B should be immunized.  Haemophilus influenzae type b (Hib) vaccine. A previously unvaccinated person with asplenia or sickle cell disease or having a scheduled splenectomy should receive 1 dose of Hib vaccine. Regardless of previous immunization, a recipient of a hematopoietic stem cell transplant should receive a 3-dose series 6-12 months after her successful transplant. Hib vaccine is not recommended for  adults with HIV infection. Preventive Services / Frequency Ages 37 to 20 years  Blood pressure check.** / Every 1 to 2 years.  Lipid and cholesterol check.** / Every 5 years beginning at age 104.  Clinical breast exam.** / Every 3 years for women in their 67s and 35s.  BRCA-related cancer risk assessment.** / For women who have family members with a BRCA-related cancer (breast, ovarian, tubal, or peritoneal cancers).  Pap test.** / Every 2 years from ages 68 through 41. Every 3 years starting at age 44 through age 66 or 58 with a history of 3 consecutive normal Pap tests.  HPV screening.** / Every 3 years from ages 97 through ages 26 to 70 with a history of 3 consecutive normal Pap tests.  Hepatitis C blood test.** / For any individual with known risks for hepatitis C.  Skin self-exam. / Monthly.  Influenza vaccine. / Every year.  Tetanus, diphtheria, and acellular pertussis (Tdap, Td) vaccine.** / Consult your health care provider. Pregnant women should receive 1 dose of Tdap vaccine during each pregnancy. 1 dose of Td every 10 years.  Varicella vaccine.** / Consult your health care provider. Pregnant females who do not have evidence of immunity should receive the first dose after pregnancy.  HPV vaccine. / 3 doses over 6 months, if 33 and younger. The vaccine is not recommended for use in pregnant females. However, pregnancy testing is not needed before receiving a dose.  Measles, mumps, rubella (MMR) vaccine.** / You need at least 1 dose of MMR if you were born in 1957 or later. You may also need a 2nd dose. For females of childbearing age, rubella immunity should be determined. If there is no evidence of immunity, females who are not pregnant should be vaccinated. If there is no evidence of immunity, females who are pregnant should delay immunization until after pregnancy.  Pneumococcal 13-valent conjugate (PCV13) vaccine.** / Consult your health care provider.  Pneumococcal  polysaccharide (PPSV23) vaccine.** / 1 to 2 doses if you smoke cigarettes or if you have certain conditions.  Meningococcal vaccine.** / 1 dose if you are age 64 to 10 years and a Market researcher living in a residence hall, or have one of several medical conditions, you need to get vaccinated against meningococcal disease. You may also need additional booster doses.  Hepatitis A vaccine.** / Consult your health care provider.  Hepatitis B vaccine.** / Consult your health care provider.  Haemophilus influenzae type b (Hib) vaccine.** / Consult your health care provider. Ages 53 to 51 years  Blood pressure check.** / Every 1 to 2 years.  Lipid and cholesterol check.** / Every 5 years beginning at age 54 years.  Lung cancer screening. / Every year if you are aged 51-80 years and have a 30-pack-year history of smoking and currently smoke or have quit within the past 15 years. Yearly screening is stopped once you have quit smoking for at least 15 years or develop a health problem that would prevent you from having lung cancer treatment.  Clinical breast exam.** / Every year after age 55 years.  BRCA-related cancer risk assessment.** / For women who have family members with a BRCA-related cancer (breast, ovarian, tubal, or peritoneal cancers).  Mammogram.** / Every year beginning at age 4 years and continuing for as long as you are in good health. Consult with your health care provider.  Pap test.** / Every 3 years starting at age 35 years through age 65 or 21 years with a history of 3 consecutive normal Pap tests.  HPV screening.** / Every 3 years from ages 23 years through ages 20 to 49 years with a history of 3 consecutive normal Pap tests.  Fecal occult blood test (FOBT) of stool. / Every year beginning at age 35 years and continuing until age 26 years. You may not need to do this test if you get a colonoscopy every 10 years.  Flexible sigmoidoscopy or colonoscopy.** / Every 5  years for a flexible sigmoidoscopy or every 10 years for a colonoscopy beginning at age 29 years and continuing until age 75 years.  Hepatitis C blood test.** / For all people born from [birth year] through 1964 and any individual with known risks for hepatitis C.  Skin self-exam. / Monthly.  Influenza vaccine. / Every year.  Tetanus, diphtheria, and acellular pertussis (Tdap/Td) vaccine.** / Consult your health care provider. Pregnant women should receive 1 dose of Tdap vaccine during each pregnancy. 1 dose of Td every 10 years.  Varicella vaccine.** / Consult your health care provider. Pregnant females who do not have evidence of immunity should receive the first dose after pregnancy.  Zoster vaccine.** / 1 dose for adults aged 44 years or older.  Measles, mumps, rubella (MMR) vaccine.** / You need at least 1 dose of MMR if you were born in 1956 or later. You may also need a 2nd dose. For females of childbearing age, rubella immunity should be determined. If there is no evidence of immunity, females who are not pregnant should be vaccinated. If there is no evidence of immunity, females who are pregnant should delay immunization until after pregnancy.  Pneumococcal 13-valent conjugate (PCV13) vaccine.** / Consult your health care provider.  Pneumococcal polysaccharide (PPSV23) vaccine.** / 1 to 2 doses if you smoke cigarettes or if you have certain conditions.  Meningococcal vaccine.** / Consult your health care provider.  Hepatitis A vaccine.** / Consult your health care provider.  Hepatitis B vaccine.** / Consult your health care provider.  Haemophilus influenzae type b (Hib) vaccine.** / Consult your health care provider. Ages 62 years and over  Blood pressure check.** / Every 1 to 2 years.  Lipid and cholesterol check.** / Every 5 years beginning at age 3 years.  Lung cancer screening. / Every year if you are aged 34-80 years and have a 30-pack-year history of smoking and currently  smoke or have quit within the past 15 years. Yearly screening is stopped once you have quit smoking for at least 15 years or develop a health problem that would prevent you from having lung cancer treatment.  Clinical breast exam.** / Every year after age 12 years.  BRCA-related cancer risk assessment.** / For women who have family members with a BRCA-related cancer (breast, ovarian, tubal, or peritoneal cancers).  Mammogram.** / Every year beginning at age 54 years and continuing for as long as you are in good health. Consult with your health care provider.  Pap test.** / Every 3 years starting at age 54 years through age 42 or 40 years with 3 consecutive normal Pap tests. Testing can be stopped between 65 and 70 years with 3 consecutive normal Pap tests and no abnormal Pap or HPV tests in the past 10 years.  HPV screening.** / Every 3 years from ages 90 years through ages 1 or 1 years with a history of 3 consecutive normal Pap tests. Testing can be stopped between 65 and 70 years with 3 consecutive normal Pap tests and no abnormal Pap or HPV tests in the past 10 years.  Fecal occult blood test (FOBT) of stool. / Every year beginning at age 59 years and continuing until age 53 years. You may not need to do this test if you get a colonoscopy every 10 years.  Flexible sigmoidoscopy or colonoscopy.** / Every 5 years for a flexible sigmoidoscopy or every 10 years for a colonoscopy beginning at age 90 years and continuing until age 10 years.  Hepatitis C blood test.** / For all people born from [birth year] through 1964 and any individual with known risks for hepatitis C.  Osteoporosis screening.** / A one-time screening for women ages 34 years and over and women at risk for fractures or osteoporosis.  Skin self-exam. / Monthly.  Influenza vaccine. / Every year.  Tetanus, diphtheria, and acellular pertussis (Tdap/Td) vaccine.** / 1 dose of Td every 10 years.  Varicella vaccine.** / Consult your  health care provider.  Zoster vaccine.** / 1 dose for adults aged 10 years or older.  Pneumococcal 13-valent conjugate (PCV13) vaccine.** / Consult your health care provider.  Pneumococcal polysaccharide (PPSV23) vaccine.** / 1 dose for all adults aged 50 years and older.  Meningococcal vaccine.** / Consult your health care provider.  Hepatitis A vaccine.** / Consult your health care provider.  Hepatitis B vaccine.** / Consult your health care provider.  Haemophilus influenzae type b (Hib) vaccine.** / Consult your health care provider. ** Family history and personal history of risk and conditions may change your health care provider's recommendations. Document Released: 03/15/2001 Document Revised: 06/03/2013 Document Reviewed: 06/14/2010 [facility] Patient Information 2015 [place], [place]. This information is not intended to replace advice given to you by your health care provider. Make sure you discuss any questions you have with your health care provider.    Sacroiliac Joint Dysfunction The sacroiliac joint connects the lower part of the spine (the sacrum) with the  bones of the pelvis. CAUSES  Sometimes, there is no obvious reason for sacroiliac joint dysfunction. Other times, it may occur   During pregnancy.  After injury, such as:  Car accidents.  Sport-related injuries.  Work-related injuries.  Due to one leg being shorter than the other.  Due to other conditions that affect the joints, such as:  Rheumatoid arthritis.  Gout.  Psoriasis.  Joint infection (septic arthritis). SYMPTOMS  Symptoms may include:  Pain in the:  Lower back.  Buttocks.  Groin.  Thighs and legs.  Difficult sitting, standing, walking, lying, bending or lifting. DIAGNOSIS  A number of tests may be used to help diagnose the cause of sacroiliac joint dysfunction, including:  Imaging tests to look for other causes of pain, including:  MRI.  CT scan.  Bone scan.  Diagnostic  injection: During a special x-ray (called fluoroscopy), a needle is put into the sacroiliac joint. A numbing medicine is injected into the joint. If the pain is improved or stopped, the diagnosis of sacroiliac joint dysfunction is more likely. TREATMENT  There are a number of types of treatment used for sacroiliac joint dysfunction, including:  Only take over-the-counter or prescription medicines for pain, discomfort, or fever as directed by your caregiver.  Medications to relax muscles.  Rest. Decreasing activity can help cut down on painful muscle spasms and allow the back to heal.  Application of heat or ice to the lower back may improve muscle spasms and soothe pain.  Brace. A special back brace, called a sacroiliac belt, can help support the joint while your back is healing.  Physical therapy can help teach comfortable positions and exercises to strengthen muscles that support the sacroiliac joint.  Cortisone injections. Injections of steroid medicine into the joint can help decrease swelling and improve pain.  Hyaluronic acid injections. This chemical improves lubrication within the sacroiliac joint, thereby decreasing pain.  Radiofrequency ablation. A special needle is placed into the joint, where it burns away nerves that are carrying pain messages from the joint.  Surgery. Because pain occurs during movement of the joint, screws and plates may be installed in order to limit or prevent joint motion. HOME CARE INSTRUCTIONS   Take all medications exactly as directed.  Follow instructions regarding both rest and physical activity, to avoid worsening the pain.  Do physical therapy exercises exactly as prescribed. SEEK IMMEDIATE MEDICAL CARE IF:  You experience increasingly severe pain.  You develop new symptoms, such as numbness or tingling in your legs or feet.  You lose bladder or bowel control. Document Released: 04/15/2008 Document Revised: 04/11/2011 Document Reviewed:  04/15/2008 [facility] Patient Information 2015 [place], [place]. This information is not intended to replace advice given to you by your health care provider. Make sure you discuss any questions you have with your health care provider. Bell's Palsy Bell's palsy is a condition in which the muscles on one side of the face cannot move (paralysis). This is because the nerves in the face are paralyzed. It is most often thought to be caused by a virus. The virus causes swelling of the nerve that controls movement on one side of the face. The nerve travels through a tight space surrounded by bone. When the nerve swells, it can be compressed by the bone. This results in damage to the protective covering around the nerve. This damage interferes with how the nerve communicates with the muscles of the face. As a result, it can cause weakness or paralysis of the facial muscles.  Injury (trauma), tumor, and surgery may cause Bell's palsy, but most of the time the cause is unknown. It is a relatively common condition. It starts suddenly (abrupt onset) with the paralysis usually ending within 2 days. Bell's palsy is not dangerous. But because the eye does not close properly, you may need care to keep the eye from getting dry. This can include splinting (to keep the eye shut) or moistening with artificial tears. Bell's palsy very seldom occurs on both sides of the face at the same time. SYMPTOMS   Eyebrow sagging.  Drooping of the eyelid and corner of the mouth.  Inability to close one eye.  Loss of taste on the front of the tongue.  Sensitivity to loud noises. TREATMENT  The treatment is usually non-surgical. If the patient is seen within the first 24 to 48 hours, a short course of steroids may be prescribed, in an attempt to shorten the length of the condition. Antiviral medicines may also be used with the steroids, but it is unclear if they are helpful.  You will need to protect your eye, if you cannot close it.  The cornea (clear covering over your eye) will become dry and can be damaged. Artificial tears can be used to keep your eye moist. Glasses or an eye patch should be worn to protect your eye. PROGNOSIS  Recovery is variable, ranging from days to months. Although the problem usually goes away completely (about 80% of cases resolve), predicting the outcome is impossible. Most people improve within 3 weeks of when the symptoms began. Improvement may continue for 3 to 6 months. A small number of people have moderate to severe weakness that is permanent.  HOME CARE INSTRUCTIONS   If your caregiver prescribed medication to reduce swelling in the nerve, use as directed. Do not stop taking the medication unless directed by your caregiver.  Use moisturizing eye drops as needed to prevent drying of your eye, as directed by your caregiver.  Protect your eye, as directed by your caregiver.  Use facial massage and exercises, as directed by your caregiver.  Perform your normal activities, and get your normal rest. SEEK IMMEDIATE MEDICAL CARE IF:   There is pain, redness or irritation in the eye.  You or your child has an oral temperature above 102 F (38.9 C), not controlled by medicine. MAKE SURE YOU:   Understand these instructions.  Will watch your condition.  Will get help right away if you are not doing well or get worse. Document Released: 01/17/2005 Document Revised: 04/11/2011 Document Reviewed: 04/26/2013 [facility] Patient Information 2015 [place], [place]. This information is not intended to replace advice given to you by your health care provider. Make sure you discuss any questions you have with your health care provider.

## 2013-11-29 NOTE — Assessment & Plan Note (Signed)
Encouraged DASH diet, decrease po intake and increase exercise as tolerated. Needs 7-8 hours of sleep nightly. Avoid trans fats, eat small, frequent meals every 4-5 hours with lean proteins, complex carbs and healthy fats. Minimize simple carbs, GMO foods. 

## 2013-11-29 NOTE — Assessment & Plan Note (Signed)
Encouraged to take no more than 400 mg of Ibuprofen at a time. May apply topical treatments such as Salon Pas prn.

## 2013-11-29 NOTE — Assessment & Plan Note (Signed)
Encouraged heart healthy diet, increase exercise, avoid trans fats, consider a krill oil cap daily 

## 2013-11-29 NOTE — Assessment & Plan Note (Addendum)
Excised, encouraged probiotics, fiber and fluids, asymptomatic at this time

## 2013-11-29 NOTE — Assessment & Plan Note (Signed)
Increase leafy greens, consider increased lean red meat and using cast iron cookware. Continue to monitor, report any concerns, resolved

## 2013-11-29 NOTE — Assessment & Plan Note (Signed)
hgba1c acceptable, minimize simple carbs. Increase exercise as tolerated.  

## 2013-11-29 NOTE — Assessment & Plan Note (Addendum)
Using CPAP intermittently, encouraged to resetart consistent use. Mask is uncomfortable. Needs full face mask. Encouraged to restart and she agrees

## 2013-12-09 ENCOUNTER — Telehealth: Payer: Self-pay | Admitting: Family Medicine

## 2013-12-09 NOTE — Telephone Encounter (Signed)
Nothing else to treat topically but sometimes we will add a probiotic such as Phillip's colon health and a fiber supplement such as Benefiber twice daily to soften stool and help stop the irritation of the hemorrhoids

## 2013-12-09 NOTE — Telephone Encounter (Signed)
Caller name: Bettejane Relation to pt: self Call back number: [phone] Pharmacy:  Reason for call:   Patient states that she is having problems with hemorrhoids and has used wipes,supposatoris, cream, and nothing is helping for this. She wants to know what else OTC Dr. Charlett Blake recommends. She states that she is not bleeding with this.

## 2013-12-09 NOTE — Telephone Encounter (Signed)
Patient informed and voiced understanding

## 2013-12-16 ENCOUNTER — Ambulatory Visit: Payer: Managed Care, Other (non HMO) | Admitting: Family Medicine

## 2014-06-09 ENCOUNTER — Encounter: Payer: Self-pay | Admitting: Family

## 2014-06-09 ENCOUNTER — Ambulatory Visit (INDEPENDENT_AMBULATORY_CARE_PROVIDER_SITE_OTHER): Payer: Managed Care, Other (non HMO) | Admitting: Family

## 2014-06-09 VITALS — BP 140/80 | HR 65 | Temp 98.1°F | Resp 16 | Ht 65.0 in | Wt 174.6 lb

## 2014-06-09 DIAGNOSIS — K645 Perianal venous thrombosis: Secondary | ICD-10-CM

## 2014-06-09 MED ORDER — HYDROCORTISONE ACE-PRAMOXINE 1-1 % RE FOAM: 1.0000 | Freq: Two times a day (BID) | RECTAL | Status: DC

## 2014-06-09 MED ORDER — IBUPROFEN 800 MG PO TABS: 400.0000 mg | ORAL_TABLET | Freq: Three times a day (TID) | ORAL | Status: DC | PRN

## 2014-06-09 NOTE — Patient Instructions (Signed)
Sprinkle epsom salts in bathtub and soak twice a day. Avoid constipation/straining.  Eat a high fiber diet and drink 64 oz of water a day. Apply proctofoam twice daily. You will be contacted about your referral to the surgeon. Call if symptoms worsen or if symptoms do not improve.

## 2014-06-09 NOTE — Progress Notes (Signed)
Subjective:    Patient ID: Chelsea Gordon, female    DOB: 02/25/1955, 59 y.o.   MRN: [id number]  HPI  Chelsea Gordon is a 59 yr old female who presents today with hx of swelling/pain of rectum x 1 week. She does have hx of thrombosed hemorrhoids. Reports that the hemorrhoids have been excised by Dr. Viona Gilmore. Denies drainage or fever. She has been using prep H, suppository. Used an Enema last night because she felt constipated. Only went a small amount.   Reports that she examined the area last night and "the whole area looked swollen."  Reports that area started out, "like a pimple" and has gone from "bad to worse"   Review of Systems See HPI  Past Medical History  Diagnosis Date  . External hemorrhoids   . Other dyspnea and respiratory abnormality   . External thrombosed hemorrhoids   . Other postprocedural status(V45.89)   . Chronic back pain   . Allergic rhinitis   . History of carpal tunnel syndrome   . Chicken pox as a child  . Anemia 11/29/2013  . Overweight 11/29/2013    History   Social History  . Marital Status: Married    Spouse Name: N/A  . Number of Children: 2  . Years of Education: 16   Occupational History  . banking    Social History Main Topics  . Smoking status: Never Smoker   . Smokeless tobacco: Never Used  . Alcohol Use: Yes  . Drug Use: No  . Sexual Activity:    Partners: Male   Other Topics Concern  . 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 Nov '12 - tough year - SO w/ CVA,  dtr's ill, work with many changes and increased stress. Jan '14 - Daughter Jinny Blossom - home. She has had some issues and change in behavior. Stressful for both parents.    Past Surgical History  Procedure Laterality Date  . Mastectomy,right    . Tubal ligation    . Hemorrhoid surgery    . Dilation and curettage of uterus      Family History  Problem Relation Age of Onset  . Cancer  Father     lung- smoke  . Heart disease Maternal Grandfather   . Cancer Maternal Grandfather     prostate  . Cancer Paternal Uncle     Prostate cancer  . Diabetes Mother     type 2  . Sarcoidosis Mother     in sinuses  . Hepatitis Sister   . Diabetes Brother   . Colitis Daughter 25    alcerative  . Anemia Daughter     No Known Allergies  Current Outpatient Prescriptions on File Prior to Visit  Medication Sig Dispense Refill  . ibuprofen (ADVIL,MOTRIN) 800 MG tablet Take 0.5 tablets (400 mg total) by mouth every 8 (eight) hours as needed. 30 tablet 11   No current facility-administered medications on file prior to visit.    BP 140/80 mmHg  Pulse 65  Temp(Src) 98.1 F (36.7 C) (Oral)  Resp 16  Ht 5\' 5"  (1.651 m)  Wt 174 lb 9.6 oz (79.198 kg)  BMI 29.05 kg/m2  SpO2 99%  LMP 05/10/2014       Objective:   Physical Exam  Constitutional: She is oriented to person, place, and time. She appears well-developed and well-nourished. No distress.  Genitourinary:  Several small  thrombosed external hemorrhoids noted.   Musculoskeletal: She exhibits no edema.  Neurological: She is alert and oriented to person, place, and time.  Psychiatric: Her behavior is normal. Judgment and thought content normal.  Flat affect          Assessment & Plan:

## 2014-06-09 NOTE — Progress Notes (Signed)
Pre visit review using our clinic review tool, if applicable. No additional management support is needed unless otherwise documented below in the visit note. 

## 2014-06-10 ENCOUNTER — Telehealth: Payer: Self-pay | Admitting: Family Medicine

## 2014-06-10 MED ORDER — HYDROCORTISONE 2.5 % RE CREA: 1.0000 "application " | TOPICAL_CREAM | Freq: Two times a day (BID) | RECTAL | Status: DC

## 2014-06-10 NOTE — Telephone Encounter (Signed)
OK to give Tdap.

## 2014-06-10 NOTE — Telephone Encounter (Signed)
Caller name: yalanda Relation to pt: self Call back number: [phone] Pharmacy: [facility] on [address]  Reason for call:   Patient states that hydrocortisone-pramoxine ([facility]) rectal foam [[id number]] is too expensive for her and would like an alternative called in.    Also, patient states that her daughter is having a baby and would like to know if she can have a whooping cough inj?

## 2014-06-10 NOTE — Telephone Encounter (Signed)
Notified pt and scheduled nurse visit of tdap for tomorrow at 9:30am.

## 2014-06-10 NOTE — Assessment & Plan Note (Addendum)
Recurrent.  Advised pt as follows:  Sprinkle epsom salts in bathtub and soak twice a day. Avoid constipation/straining.  Eat a high fiber diet and drink 64 oz of water a day. Apply proctofoam twice daily. You will be contacted about your referral to the surgeon (she requests apt Friday afternoon) Call if symptoms worsen or if symptoms do not improve.

## 2014-06-10 NOTE — Telephone Encounter (Signed)
Try anusol HC instead- rx sent.

## 2014-06-10 NOTE — Telephone Encounter (Signed)
Pt received TD 02/20/12. Please advise re: Rx and vaccine?

## 2014-06-11 ENCOUNTER — Ambulatory Visit (INDEPENDENT_AMBULATORY_CARE_PROVIDER_SITE_OTHER): Payer: Managed Care, Other (non HMO) | Admitting: *Deleted

## 2014-06-11 DIAGNOSIS — Z23 Encounter for immunization: Secondary | ICD-10-CM

## 2014-06-11 NOTE — Progress Notes (Signed)
Pre visit review using our clinic review tool, if applicable. No additional management support is needed unless otherwise documented below in the visit note.  Patient tolerated injection well.  

## 2014-08-05 ENCOUNTER — Other Ambulatory Visit: Payer: Self-pay | Admitting: Family Medicine

## 2014-08-05 MED ORDER — IBUPROFEN 800 MG PO TABS: 400.0000 mg | ORAL_TABLET | Freq: Three times a day (TID) | ORAL | Status: DC | PRN

## 2014-08-12 ENCOUNTER — Encounter: Payer: Self-pay | Admitting: Genetic Counselor

## 2014-09-17 ENCOUNTER — Other Ambulatory Visit: Payer: Self-pay | Admitting: Family Medicine

## 2014-11-14 ENCOUNTER — Ambulatory Visit (INDEPENDENT_AMBULATORY_CARE_PROVIDER_SITE_OTHER): Payer: Managed Care, Other (non HMO) | Admitting: Family Medicine

## 2014-11-14 ENCOUNTER — Encounter: Payer: Self-pay | Admitting: Family Medicine

## 2014-11-14 VITALS — BP 130/74 | HR 66 | Temp 98.9°F | Ht 65.0 in | Wt 174.4 lb

## 2014-11-14 DIAGNOSIS — E782 Mixed hyperlipidemia: Secondary | ICD-10-CM

## 2014-11-14 DIAGNOSIS — C50911 Malignant neoplasm of unspecified site of right female breast: Secondary | ICD-10-CM

## 2014-11-14 DIAGNOSIS — Z23 Encounter for immunization: Secondary | ICD-10-CM

## 2014-11-14 DIAGNOSIS — E785 Hyperlipidemia, unspecified: Secondary | ICD-10-CM

## 2014-11-14 DIAGNOSIS — K219 Gastro-esophageal reflux disease without esophagitis: Secondary | ICD-10-CM

## 2014-11-14 DIAGNOSIS — J309 Allergic rhinitis, unspecified: Secondary | ICD-10-CM | POA: Diagnosis not present

## 2014-11-14 DIAGNOSIS — E663 Overweight: Secondary | ICD-10-CM

## 2014-11-14 DIAGNOSIS — R739 Hyperglycemia, unspecified: Secondary | ICD-10-CM

## 2014-11-14 DIAGNOSIS — Z0000 Encounter for general adult medical examination without abnormal findings: Secondary | ICD-10-CM

## 2014-11-14 DIAGNOSIS — G4733 Obstructive sleep apnea (adult) (pediatric): Secondary | ICD-10-CM

## 2014-11-14 HISTORY — DX: Gastro-esophageal reflux disease without esophagitis: K21.9

## 2014-11-14 HISTORY — DX: Allergic rhinitis, unspecified: J30.9

## 2014-11-14 LAB — COMPREHENSIVE METABOLIC PANEL
ALBUMIN: 4 g/dL (ref 3.5–5.2)
ALT: 15 U/L (ref 0–35)
AST: 18 U/L (ref 0–37)
Alkaline Phosphatase: 70 U/L (ref 39–117)
BUN: 12 mg/dL (ref 6–23)
CHLORIDE: 106 meq/L (ref 96–112)
CO2: 26 mEq/L (ref 19–32)
CREATININE: 0.61 mg/dL (ref 0.40–1.20)
Calcium: 9.4 mg/dL (ref 8.4–10.5)
GFR: 129.56 mL/min (ref >60.00)
Glucose, Bld: 78 mg/dL (ref 70–99)
Potassium: 3.9 mEq/L (ref 3.5–5.1)
SODIUM: 141 meq/L (ref 135–145)
TOTAL PROTEIN: 7.4 g/dL (ref 6.0–8.3)
Total Bilirubin: 0.5 mg/dL (ref 0.2–1.2)

## 2014-11-14 LAB — LIPID PANEL
CHOLESTEROL: 168 mg/dL (ref 0–200)
HDL: 46.5 mg/dL (ref >39.00)
LDL Cholesterol: 112 mg/dL — ABNORMAL HIGH (ref 0–99)
NonHDL: 121.57
Total CHOL/HDL Ratio: 4
Triglycerides: 50 mg/dL (ref 0.0–149.0)
VLDL: 10 mg/dL (ref 0.0–40.0)

## 2014-11-14 LAB — CBC
HCT: 37 % (ref 36.0–46.0)
HEMOGLOBIN: 12.2 g/dL (ref 12.0–15.0)
MCHC: 33.1 g/dL (ref 30.0–36.0)
MCV: 86.6 fl (ref 78.0–100.0)
Platelets: 233 10*3/uL (ref 150.0–400.0)
RBC: 4.27 Mil/uL (ref 3.87–5.11)
RDW: 13.7 % (ref 11.5–15.5)
WBC: 4.5 10*3/uL (ref 4.0–10.5)

## 2014-11-14 LAB — TSH: TSH: 1.08 u[IU]/mL (ref 0.35–4.50)

## 2014-11-14 LAB — HEMOGLOBIN A1C: HEMOGLOBIN A1C: 5.7 % (ref 4.6–6.5)

## 2014-11-14 NOTE — Assessment & Plan Note (Signed)
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

## 2014-11-14 NOTE — Assessment & Plan Note (Signed)
Uses CPAP intermittently

## 2014-11-14 NOTE — Patient Instructions (Signed)
Probiotics daily such as Digestive Advantage gummies or NOW 10 strain 1 cap daily (can order at [email or web address])  Preventive Care for Adults, Female A healthy lifestyle and preventive care can promote health and wellness. Preventive health guidelines for women include the following key practices.  A routine yearly physical is a good way to check with your health care provider about your health and preventive screening. It is a chance to share any concerns and updates on your health and to receive a thorough exam.  Visit your dentist for a routine exam and preventive care every 6 months. Brush your teeth twice a day and floss once a day. Good oral hygiene prevents tooth decay and gum disease.  The frequency of eye exams is based on your age, health, family medical history, use of contact lenses, and other factors. Follow your health care provider's recommendations for frequency of eye exams.  Eat a healthy diet. Foods like vegetables, fruits, whole grains, low-fat dairy products, and lean protein foods contain the nutrients you need without too many calories. Decrease your intake of foods high in solid fats, added sugars, and salt. Eat the right amount of calories for you.Get information about a proper diet from your health care provider, if necessary.  Regular physical exercise is one of the most important things you can do for your health. Most adults should get at least 150 minutes of moderate-intensity exercise (any activity that increases your heart rate and causes you to sweat) each week. In addition, most adults need muscle-strengthening exercises on 2 or more days a week.  Maintain a healthy weight. The body mass index (BMI) is a screening tool to identify possible weight problems. It provides an estimate of body fat based on height and weight. Your health care provider can find your BMI and can help you achieve or maintain a healthy weight.For adults 20 years and older:  A BMI below 18.5  is considered underweight.  A BMI of 18.5 to 24.9 is normal.  A BMI of 25 to 29.9 is considered overweight.  A BMI of 30 and above is considered obese.  Maintain normal blood lipids and cholesterol levels by exercising and minimizing your intake of saturated fat. Eat a balanced diet with plenty of fruit and vegetables. Blood tests for lipids and cholesterol should begin at age 71 and be repeated every 5 years. If your lipid or cholesterol levels are high, you are over 50, or you are at high risk for heart disease, you may need your cholesterol levels checked more frequently.Ongoing high lipid and cholesterol levels should be treated with medicines if diet and exercise are not working.  If you smoke, find out from your health care provider how to quit. If you do not use tobacco, do not start.  Lung cancer screening is recommended for adults aged 48-80 years who are at high risk for developing lung cancer because of a history of smoking. A yearly low-dose CT scan of the lungs is recommended for people who have at least a 30-pack-year history of smoking and are a current smoker or have quit within the past 15 years. A pack year of smoking is smoking an average of 1 pack of cigarettes a day for 1 year (for example: 1 pack a day for 30 years or 2 packs a day for 15 years). Yearly screening should continue until the smoker has stopped smoking for at least 15 years. Yearly screening should be stopped for people who develop a health problem that would  prevent them from having lung cancer treatment.  If you are pregnant, do not drink alcohol. If you are breastfeeding, be very cautious about drinking alcohol. If you are not pregnant and choose to drink alcohol, do not have more than 1 drink per day. One drink is considered to be 12 ounces (355 mL) of beer, 5 ounces (148 mL) of wine, or 1.5 ounces (44 mL) of liquor.  Avoid use of street drugs. Do not share needles with anyone. Ask for help if you need support  or instructions about stopping the use of drugs.  High blood pressure causes heart disease and increases the risk of stroke. Your blood pressure should be checked at least every 1 to 2 years. Ongoing high blood pressure should be treated with medicines if weight loss and exercise do not work.  If you are 18-18 years old, ask your health care provider if you should take aspirin to prevent strokes.  Diabetes screening is done by taking a blood sample to check your blood glucose level after you have not eaten for a certain period of time (fasting). If you are not overweight and you do not have risk factors for diabetes, you should be screened once every 3 years starting at age 64. If you are overweight or obese and you are 57-13 years of age, you should be screened for diabetes every year as part of your cardiovascular risk assessment.  Breast cancer screening is essential preventive care for women. You should practice "breast self-awareness." This means understanding the normal appearance and feel of your breasts and may include breast self-examination. Any changes detected, no matter how small, should be reported to a health care provider. Women in their 16s and 30s should have a clinical breast exam (CBE) by a health care provider as part of a regular health exam every 1 to 3 years. After age 35, women should have a CBE every year. Starting at age 62, women should consider having a mammogram (breast X-ray test) every year. Women who have a family history of breast cancer should talk to their health care provider about genetic screening. Women at a high risk of breast cancer should talk to their health care providers about having an MRI and a mammogram every year.  Breast cancer gene (BRCA)-related cancer risk assessment is recommended for women who have family members with BRCA-related cancers. BRCA-related cancers include breast, ovarian, tubal, and peritoneal cancers. Having family members with these  cancers may be associated with an increased risk for harmful changes (mutations) in the breast cancer genes BRCA1 and BRCA2. Results of the assessment will determine the need for genetic counseling and BRCA1 and BRCA2 testing.  Your health care provider may recommend that you be screened regularly for cancer of the pelvic organs (ovaries, uterus, and vagina). This screening involves a pelvic examination, including checking for microscopic changes to the surface of your cervix (Pap test). You may be encouraged to have this screening done every 3 years, beginning at age 76.  For women ages 48-65, health care providers may recommend pelvic exams and Pap testing every 3 years, or they may recommend the Pap and pelvic exam, combined with testing for human papilloma virus (HPV), every 5 years. Some types of HPV increase your risk of cervical cancer. Testing for HPV may also be done on women of any age with unclear Pap test results.  Other health care providers may not recommend any screening for nonpregnant women who are considered low risk for pelvic cancer  and who do not have symptoms. Ask your health care provider if a screening pelvic exam is right for you.  If you have had past treatment for cervical cancer or a condition that could lead to cancer, you need Pap tests and screening for cancer for at least 20 years after your treatment. If Pap tests have been discontinued, your risk factors (such as having a new sexual partner) need to be reassessed to determine if screening should resume. Some women have medical problems that increase the chance of getting cervical cancer. In these cases, your health care provider may recommend more frequent screening and Pap tests.  Colorectal cancer can be detected and often prevented. Most routine colorectal cancer screening begins at the age of 38 years and continues through age 79 years. However, your health care provider may recommend screening at an earlier age if you  have risk factors for colon cancer. On a yearly basis, your health care provider may provide home test kits to check for hidden blood in the stool. Use of a small camera at the end of a tube, to directly examine the colon (sigmoidoscopy or colonoscopy), can detect the earliest forms of colorectal cancer. Talk to your health care provider about this at age 41, when routine screening begins. Direct exam of the colon should be repeated every 5-10 years through age 45 years, unless early forms of precancerous polyps or small growths are found.  People who are at an increased risk for hepatitis B should be screened for this virus. You are considered at high risk for hepatitis B if:  You were born in a country where hepatitis B occurs often. Talk with your health care provider about which countries are considered high risk.  Your parents were born in a high-risk country and you have not received a shot to protect against hepatitis B (hepatitis B vaccine).  You have HIV or AIDS.  You use needles to inject street drugs.  You live with, or have sex with, someone who has hepatitis B.  You get hemodialysis treatment.  You take certain medicines for conditions like cancer, organ transplantation, and autoimmune conditions.  Hepatitis C blood testing is recommended for all people born from [birth year] through 1965 and any individual with known risks for hepatitis C.  Practice safe sex. Use condoms and avoid high-risk sexual practices to reduce the spread of sexually transmitted infections (STIs). STIs include gonorrhea, chlamydia, syphilis, trichomonas, herpes, HPV, and human immunodeficiency virus (HIV). Herpes, HIV, and HPV are viral illnesses that have no cure. They can result in disability, cancer, and death.  You should be screened for sexually transmitted illnesses (STIs) including gonorrhea and chlamydia if:  You are sexually active and are younger than 24 years.  You are older than 24 years and your  health care provider tells you that you are at risk for this type of infection.  Your sexual activity has changed since you were last screened and you are at an increased risk for chlamydia or gonorrhea. Ask your health care provider if you are at risk.  If you are at risk of being infected with HIV, it is recommended that you take a prescription medicine daily to prevent HIV infection. This is called preexposure prophylaxis (PrEP). You are considered at risk if:  You are sexually active and do not regularly use condoms or know the HIV status of your partner(s).  You take drugs by injection.  You are sexually active with a partner who has HIV.  Talk with your health care provider about whether you are at high risk of being infected with HIV. If you choose to begin PrEP, you should first be tested for HIV. You should then be tested every 3 months for as long as you are taking PrEP.  Osteoporosis is a disease in which the bones lose minerals and strength with aging. This can result in serious bone fractures or breaks. The risk of osteoporosis can be identified using a bone density scan. Women ages 86 years and over and women at risk for fractures or osteoporosis should discuss screening with their health care providers. Ask your health care provider whether you should take a calcium supplement or vitamin D to reduce the rate of osteoporosis.  Menopause can be associated with physical symptoms and risks. Hormone replacement therapy is available to decrease symptoms and risks. You should talk to your health care provider about whether hormone replacement therapy is right for you.  Use sunscreen. Apply sunscreen liberally and repeatedly throughout the day. You should seek shade when your shadow is shorter than you. Protect yourself by wearing long sleeves, pants, a wide-brimmed hat, and sunglasses year round, whenever you are outdoors.  Once a month, do a whole body skin exam, using a mirror to look  at the skin on your back. Tell your health care provider of new moles, moles that have irregular borders, moles that are larger than a pencil eraser, or moles that have changed in shape or color.  Stay current with required vaccines (immunizations).  Influenza vaccine. All adults should be immunized every year.  Tetanus, diphtheria, and acellular pertussis (Td, Tdap) vaccine. Pregnant women should receive 1 dose of Tdap vaccine during each pregnancy. The dose should be obtained regardless of the length of time since the last dose. Immunization is preferred during the 27th-36th week of gestation. An adult who has not previously received Tdap or who does not know her vaccine status should receive 1 dose of Tdap. This initial dose should be followed by tetanus and diphtheria toxoids (Td) booster doses every 10 years. Adults with an unknown or incomplete history of completing a 3-dose immunization series with Td-containing vaccines should begin or complete a primary immunization series including a Tdap dose. Adults should receive a Td booster every 10 years.  Varicella vaccine. An adult without evidence of immunity to varicella should receive 2 doses or a second dose if she has previously received 1 dose. Pregnant females who do not have evidence of immunity should receive the first dose after pregnancy. This first dose should be obtained before leaving the health care facility. The second dose should be obtained 4-8 weeks after the first dose.  Human papillomavirus (HPV) vaccine. Females aged 13-26 years who have not received the vaccine previously should obtain the 3-dose series. The vaccine is not recommended for use in pregnant females. However, pregnancy testing is not needed before receiving a dose. If a female is found to be pregnant after receiving a dose, no treatment is needed. In that case, the remaining doses should be delayed until after the pregnancy. Immunization is recommended for any person  with an immunocompromised condition through the age of 37 years if she did not get any or all doses earlier. During the 3-dose series, the second dose should be obtained 4-8 weeks after the first dose. The third dose should be obtained 24 weeks after the first dose and 16 weeks after the second dose.  Zoster vaccine. One dose is recommended for adults  aged 61 years or older unless certain conditions are present.  Measles, mumps, and rubella (MMR) vaccine. Adults born before [birth year] generally are considered immune to measles and mumps. Adults born in [birth year] or later should have 1 or more doses of MMR vaccine unless there is a contraindication to the vaccine or there is laboratory evidence of immunity to each of the three diseases. A routine second dose of MMR vaccine should be obtained at least 28 days after the first dose for students attending postsecondary schools, health care workers, or international travelers. People who received inactivated measles vaccine or an unknown type of measles vaccine during 1963-1967 should receive 2 doses of MMR vaccine. People who received inactivated mumps vaccine or an unknown type of mumps vaccine before 1979 and are at high risk for mumps infection should consider immunization with 2 doses of MMR vaccine. For females of childbearing age, rubella immunity should be determined. If there is no evidence of immunity, females who are not pregnant should be vaccinated. If there is no evidence of immunity, females who are pregnant should delay immunization until after pregnancy. Unvaccinated health care workers born before [birth year] who lack laboratory evidence of measles, mumps, or rubella immunity or laboratory confirmation of disease should consider measles and mumps immunization with 2 doses of MMR vaccine or rubella immunization with 1 dose of MMR vaccine.  Pneumococcal 13-valent conjugate (PCV13) vaccine. When indicated, a person who is uncertain of his immunization history and has  no record of immunization should receive the PCV13 vaccine. All adults 78 years of age and older should receive this vaccine. An adult aged 103 years or older who has certain medical conditions and has not been previously immunized should receive 1 dose of PCV13 vaccine. This PCV13 should be followed with a dose of pneumococcal polysaccharide (PPSV23) vaccine. Adults who are at high risk for pneumococcal disease should obtain the PPSV23 vaccine at least 8 weeks after the dose of PCV13 vaccine. Adults older than 58 years of age who have normal immune system function should obtain the PPSV23 vaccine dose at least 1 year after the dose of PCV13 vaccine.  Pneumococcal polysaccharide (PPSV23) vaccine. When PCV13 is also indicated, PCV13 should be obtained first. All adults aged 62 years and older should be immunized. An adult younger than age 78 years who has certain medical conditions should be immunized. Any person who resides in a nursing home or long-term care facility should be immunized. An adult smoker should be immunized. People with an immunocompromised condition and certain other conditions should receive both PCV13 and PPSV23 vaccines. People with human immunodeficiency virus (HIV) infection should be immunized as soon as possible after diagnosis. Immunization during chemotherapy or radiation therapy should be avoided. Routine use of PPSV23 vaccine is not recommended for American Indians, [place] Natives, or people younger than 65 years unless there are medical conditions that require PPSV23 vaccine. When indicated, people who have unknown immunization and have no record of immunization should receive PPSV23 vaccine. One-time revaccination 5 years after the first dose of PPSV23 is recommended for people aged 19-64 years who have chronic kidney failure, nephrotic syndrome, asplenia, or immunocompromised conditions. People who received 1-2 doses of PPSV23 before age 39 years should receive another dose of PPSV23  vaccine at age 86 years or later if at least 5 years have passed since the previous dose. Doses of PPSV23 are not needed for people immunized with PPSV23 at or after age 96 years.  Meningococcal vaccine. Adults with asplenia or  persistent complement component deficiencies should receive 2 doses of quadrivalent meningococcal conjugate (MenACWY-D) vaccine. The doses should be obtained at least 2 months apart. Microbiologists working with certain meningococcal bacteria, [place] recruits, people at risk during an outbreak, and people who travel to or live in countries with a high rate of meningitis should be immunized. A first-year college student up through age 34 years who is living in a residence hall should receive a dose if she did not receive a dose on or after her 16th birthday. Adults who have certain high-risk conditions should receive one or more doses of vaccine.  Hepatitis A vaccine. Adults who wish to be protected from this disease, have certain high-risk conditions, work with hepatitis A-infected animals, work in hepatitis A research labs, or travel to or work in countries with a high rate of hepatitis A should be immunized. Adults who were previously unvaccinated and who anticipate close contact with an international adoptee during the first 60 days after arrival in the [place] from a country with a high rate of hepatitis A should be immunized.  Hepatitis B vaccine. Adults who wish to be protected from this disease, have certain high-risk conditions, may be exposed to blood or other infectious body fluids, are household contacts or sex partners of hepatitis B positive people, are clients or workers in certain care facilities, or travel to or work in countries with a high rate of hepatitis B should be immunized.  Haemophilus influenzae type b (Hib) vaccine. A previously unvaccinated person with asplenia or sickle cell disease or having a scheduled splenectomy should receive 1 dose of Hib  vaccine. Regardless of previous immunization, a recipient of a hematopoietic stem cell transplant should receive a 3-dose series 6-12 months after her successful transplant. Hib vaccine is not recommended for adults with HIV infection. Preventive Services / Frequency Ages 35 to 23 years  Blood pressure check.** / Every 3-5 years.  Lipid and cholesterol check.** / Every 5 years beginning at age 65.  Clinical breast exam.** / Every 3 years for women in their 84s and 89s.  BRCA-related cancer risk assessment.** / For women who have family members with a BRCA-related cancer (breast, ovarian, tubal, or peritoneal cancers).  Pap test.** / Every 2 years from ages 40 through 53. Every 3 years starting at age 48 through age 94 or 96 with a history of 3 consecutive normal Pap tests.  HPV screening.** / Every 3 years from ages 73 through ages 7 to 25 with a history of 3 consecutive normal Pap tests.  Hepatitis C blood test.** / For any individual with known risks for hepatitis C.  Skin self-exam. / Monthly.  Influenza vaccine. / Every year.  Tetanus, diphtheria, and acellular pertussis (Tdap, Td) vaccine.** / Consult your health care provider. Pregnant women should receive 1 dose of Tdap vaccine during each pregnancy. 1 dose of Td every 10 years.  Varicella vaccine.** / Consult your health care provider. Pregnant females who do not have evidence of immunity should receive the first dose after pregnancy.  HPV vaccine. / 3 doses over 6 months, if 91 and younger. The vaccine is not recommended for use in pregnant females. However, pregnancy testing is not needed before receiving a dose.  Measles, mumps, rubella (MMR) vaccine.** / You need at least 1 dose of MMR if you were born in 1957 or later. You may also need a 2nd dose. For females of childbearing age, rubella immunity should be determined. If there is no evidence of  immunity, females who are not pregnant should be vaccinated. If there is no  evidence of immunity, females who are pregnant should delay immunization until after pregnancy.  Pneumococcal 13-valent conjugate (PCV13) vaccine.** / Consult your health care provider.  Pneumococcal polysaccharide (PPSV23) vaccine.** / 1 to 2 doses if you smoke cigarettes or if you have certain conditions.  Meningococcal vaccine.** / 1 dose if you are age 8 to 37 years and a Market researcher living in a residence hall, or have one of several medical conditions, you need to get vaccinated against meningococcal disease. You may also need additional booster doses.  Hepatitis A vaccine.** / Consult your health care provider.  Hepatitis B vaccine.** / Consult your health care provider.  Haemophilus influenzae type b (Hib) vaccine.** / Consult your health care provider. Ages 12 to 80 years  Blood pressure check.** / Every year.  Lipid and cholesterol check.** / Every 5 years beginning at age 29 years.  Lung cancer screening. / Every year if you are aged 56-80 years and have a 30-pack-year history of smoking and currently smoke or have quit within the past 15 years. Yearly screening is stopped once you have quit smoking for at least 15 years or develop a health problem that would prevent you from having lung cancer treatment.  Clinical breast exam.** / Every year after age 90 years.  BRCA-related cancer risk assessment.** / For women who have family members with a BRCA-related cancer (breast, ovarian, tubal, or peritoneal cancers).  Mammogram.** / Every year beginning at age 67 years and continuing for as long as you are in good health. Consult with your health care provider.  Pap test.** / Every 3 years starting at age 63 years through age 50 or 69 years with a history of 3 consecutive normal Pap tests.  HPV screening.** / Every 3 years from ages 49 years through ages 51 to 41 years with a history of 3 consecutive normal Pap tests.  Fecal occult blood test (FOBT) of stool. /  Every year beginning at age 52 years and continuing until age 29 years. You may not need to do this test if you get a colonoscopy every 10 years.  Flexible sigmoidoscopy or colonoscopy.** / Every 5 years for a flexible sigmoidoscopy or every 10 years for a colonoscopy beginning at age 45 years and continuing until age 90 years.  Hepatitis C blood test.** / For all people born from [birth year] through 1964 and any individual with known risks for hepatitis C.  Skin self-exam. / Monthly.  Influenza vaccine. / Every year.  Tetanus, diphtheria, and acellular pertussis (Tdap/Td) vaccine.** / Consult your health care provider. Pregnant women should receive 1 dose of Tdap vaccine during each pregnancy. 1 dose of Td every 10 years.  Varicella vaccine.** / Consult your health care provider. Pregnant females who do not have evidence of immunity should receive the first dose after pregnancy.  Zoster vaccine.** / 1 dose for adults aged 4 years or older.  Measles, mumps, rubella (MMR) vaccine.** / You need at least 1 dose of MMR if you were born in 1957 or later. You may also need a second dose. For females of childbearing age, rubella immunity should be determined. If there is no evidence of immunity, females who are not pregnant should be vaccinated. If there is no evidence of immunity, females who are pregnant should delay immunization until after pregnancy.  Pneumococcal 13-valent conjugate (PCV13) vaccine.** / Consult your health care provider.  Pneumococcal polysaccharide (PPSV23) vaccine.** /  1 to 2 doses if you smoke cigarettes or if you have certain conditions.  Meningococcal vaccine.** / Consult your health care provider.  Hepatitis A vaccine.** / Consult your health care provider.  Hepatitis B vaccine.** / Consult your health care provider.  Haemophilus influenzae type b (Hib) vaccine.** / Consult your health care provider. Ages 41 years and over  Blood pressure check.** / Every year.  Lipid  and cholesterol check.** / Every 5 years beginning at age 16 years.  Lung cancer screening. / Every year if you are aged 53-80 years and have a 30-pack-year history of smoking and currently smoke or have quit within the past 15 years. Yearly screening is stopped once you have quit smoking for at least 15 years or develop a health problem that would prevent you from having lung cancer treatment.  Clinical breast exam.** / Every year after age 100 years.  BRCA-related cancer risk assessment.** / For women who have family members with a BRCA-related cancer (breast, ovarian, tubal, or peritoneal cancers).  Mammogram.** / Every year beginning at age 65 years and continuing for as long as you are in good health. Consult with your health care provider.  Pap test.** / Every 3 years starting at age 75 years through age 81 or 65 years with 3 consecutive normal Pap tests. Testing can be stopped between 65 and 70 years with 3 consecutive normal Pap tests and no abnormal Pap or HPV tests in the past 10 years.  HPV screening.** / Every 3 years from ages 53 years through ages 41 or 56 years with a history of 3 consecutive normal Pap tests. Testing can be stopped between 65 and 70 years with 3 consecutive normal Pap tests and no abnormal Pap or HPV tests in the past 10 years.  Fecal occult blood test (FOBT) of stool. / Every year beginning at age 66 years and continuing until age 93 years. You may not need to do this test if you get a colonoscopy every 10 years.  Flexible sigmoidoscopy or colonoscopy.** / Every 5 years for a flexible sigmoidoscopy or every 10 years for a colonoscopy beginning at age 79 years and continuing until age 56 years.  Hepatitis C blood test.** / For all people born from [birth year] through 1964 and any individual with known risks for hepatitis C.  Osteoporosis screening.** / A one-time screening for women ages 46 years and over and women at risk for fractures or osteoporosis.  Skin  self-exam. / Monthly.  Influenza vaccine. / Every year.  Tetanus, diphtheria, and acellular pertussis (Tdap/Td) vaccine.** / 1 dose of Td every 10 years.  Varicella vaccine.** / Consult your health care provider.  Zoster vaccine.** / 1 dose for adults aged 52 years or older.  Pneumococcal 13-valent conjugate (PCV13) vaccine.** / Consult your health care provider.  Pneumococcal polysaccharide (PPSV23) vaccine.** / 1 dose for all adults aged 4 years and older.  Meningococcal vaccine.** / Consult your health care provider.  Hepatitis A vaccine.** / Consult your health care provider.  Hepatitis B vaccine.** / Consult your health care provider.  Haemophilus influenzae type b (Hib) vaccine.** / Consult your health care provider. ** Family history and personal history of risk and conditions may change your health care provider's recommendations.   This information is not intended to replace advice given to you by your health care provider. Make sure you discuss any questions you have with your health care provider.   Document Released: 03/15/2001 Document Revised: 02/07/2014 Document Reviewed: 06/14/2010 Elsevier  Interactive Patient Education [employer].

## 2014-11-14 NOTE — Progress Notes (Signed)
Pre visit review using our clinic review tool, if applicable. No additional management support is needed unless otherwise documented below in the visit note. 

## 2014-11-18 ENCOUNTER — Telehealth: Payer: Self-pay | Admitting: Family Medicine

## 2014-11-18 NOTE — Telephone Encounter (Signed)
Notify lab work looks good, sugar better, cholesterol very mildly off, no new concerns.

## 2014-11-18 NOTE — Telephone Encounter (Signed)
Called left message to call back 

## 2014-11-18 NOTE — Telephone Encounter (Signed)
Patient informed of results.   Updated the patients OBGYN MD under her care team.

## 2014-11-18 NOTE — Telephone Encounter (Signed)
Pt called to see if lab results are available. Please call on her cell phone.  Also, please update her GYN: Thurnell Lose, MD Primary Specialty: Obstetrics & Gynecology Secondary Specialty: Gynecology [facility] [address] [place] [address] Phone: [phone]

## 2014-11-19 ENCOUNTER — Telehealth: Payer: Self-pay | Admitting: Family Medicine

## 2014-11-19 NOTE — Telephone Encounter (Signed)
Caller name: Chelsea Gordon  Relationship to patient: Self   Can be reached: [phone]   Reason for call: Pt called in requesting her lab results from last year and this year. She says that she like to compare them. If possible she would like to have them mailed to her home address. (on file)

## 2014-11-19 NOTE — Telephone Encounter (Signed)
Pt could either set up MyChart account or contact medical records.

## 2014-11-19 NOTE — Telephone Encounter (Signed)
Called pt back and informed her of her options. Pt originally declined MyChart, provided her with the phone number to Medical Records. She expressed understanding and will call them for Labs requested.

## 2014-11-21 ENCOUNTER — Other Ambulatory Visit: Payer: Self-pay | Admitting: Obstetrics and Gynecology

## 2014-11-21 LAB — HM MAMMOGRAPHY

## 2014-11-23 ENCOUNTER — Encounter: Payer: Self-pay | Admitting: Family Medicine

## 2014-11-23 DIAGNOSIS — Z0000 Encounter for general adult medical examination without abnormal findings: Secondary | ICD-10-CM

## 2014-11-23 HISTORY — DX: Encounter for general adult medical examination without abnormal findings: Z00.00

## 2014-11-23 NOTE — Assessment & Plan Note (Signed)
Avoid offending foods, take probiotics. Do not eat large meals in late evening and consider raising head of bed.  

## 2014-11-23 NOTE — Progress Notes (Signed)
Chelsea Gordon [id number] 12-06-1955 11/23/2014      Progress Note New Patient  Subjective  Chief Complaint  Chief Complaint  Patient presents with  . Annual Exam    HPI  Patient is a 59 year old female in today for routine medical care. She is here today for annual exam. She is doing well. She has some mild decreased hearing and some itching in her ears recently. Scratchy throat intermittently as well. No congestion. Mild heartburn infrequently. Follows with GYN for paps and MGMs. No recent illness. Denies CP/palp/SOB/HA/congestion/fevers/GI or GU c/o. Taking meds as prescribed  Past Medical History  Diagnosis Date  . External hemorrhoids   . Other dyspnea and respiratory abnormality   . External thrombosed hemorrhoids   . Other postprocedural status(V45.89)   . Chronic back pain   . Allergic rhinitis   . History of carpal tunnel syndrome   . Chicken pox as a child  . Anemia 11/29/2013  . Overweight 11/29/2013  . GERD (gastroesophageal reflux disease) 11/14/2014  . Allergic rhinitis 11/14/2014    Past Surgical History  Procedure Laterality Date  . Mastectomy,right    . Tubal ligation    . Hemorrhoid surgery    . Dilation and curettage of uterus      Family History  Problem Relation Age of Onset  . Cancer Father     lung- smoke  . Heart disease Maternal Grandfather   . Cancer Maternal Grandfather     prostate  . Cancer Paternal Uncle     Prostate cancer  . Diabetes Mother     type 2  . Sarcoidosis Mother     in sinuses, spreading  . Thyroid disease Mother   . Asthma Mother   . Hyperlipidemia Mother   . Hepatitis Sister   . Diabetes Brother   . Colitis Daughter 25    alcerative  . Anemia Daughter     Social History   Social History  . Marital Status: Married    Spouse Name: N/A  . Number of Children: 2  . Years of Education: 16   Occupational History  . banking    Social History Main Topics  . Smoking status: Never Smoker   . Smokeless  tobacco: Never Used  . Alcohol Use: Yes  . Drug Use: No  . Sexual Activity:    Partners: Male   Other Topics Concern  . 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 Nov '12 - tough year - SO w/ CVA,  dtr's ill, work with many changes and increased stress. Jan '14 - Daughter Jinny Blossom - home. She has had some issues and change in behavior. Stressful for both parents.    Current Outpatient Prescriptions on File Prior to Visit  Medication Sig Dispense Refill  . ibuprofen (ADVIL,MOTRIN) 800 MG tablet TAKE 1/2 TABLET BY MOUTH EVERY 8 HOURS AS NEEDED 30 tablet 0   No current facility-administered medications on file prior to visit.    No Known Allergies  Review of Systems  Review of Systems  Constitutional: Negative for fever, chills and malaise/fatigue.  HENT: Negative for congestion, ear discharge and hearing loss.   Eyes: Negative for discharge.  Respiratory: Negative for cough, sputum production and shortness of breath.   Cardiovascular: Negative for chest pain, palpitations and leg swelling.  Gastrointestinal: Negative for heartburn, nausea, vomiting, abdominal pain, diarrhea, constipation and blood in stool.  Genitourinary: Negative for  dysuria, urgency, frequency and hematuria.  Musculoskeletal: Negative for myalgias, back pain and falls.  Skin: Negative for rash.  Neurological: Negative for dizziness, sensory change, loss of consciousness, weakness and headaches.  Endo/Heme/Allergies: Negative for environmental allergies. Does not bruise/bleed easily.  Psychiatric/Behavioral: Negative for depression and suicidal ideas. The patient is not nervous/anxious and does not have insomnia.     Objective  BP 140/72 mmHg  Pulse 66  Temp(Src) 98.9 F (37.2 C) (Oral)  Ht 5\' 5"  (1.651 m)  Wt 174 lb 6.4 oz (79.107 kg)  BMI 29.02 kg/m2  SpO2 100%  LMP 05/10/2014  Physical Exam  Physical Exam   Constitutional: She is oriented to person, place, and time and well-developed, well-nourished, and in no distress. No distress.  HENT:  Head: Normocephalic and atraumatic.  Right Ear: External ear normal.  Left Ear: External ear normal.  Nose: Nose normal.  Mouth/Throat: Oropharynx is clear and moist. No oropharyngeal exudate.  Eyes: Conjunctivae are normal. Pupils are equal, round, and reactive to light. Right eye exhibits no discharge. Left eye exhibits no discharge. No scleral icterus.  Neck: Normal range of motion. Neck supple. No thyromegaly present.  Cardiovascular: Normal rate, regular rhythm, normal heart sounds and intact distal pulses.   No murmur heard. Pulmonary/Chest: Effort normal and breath sounds normal. No respiratory distress. She has no wheezes. She has no rales.  Abdominal: Soft. Bowel sounds are normal. She exhibits no distension and no mass. There is no tenderness.  Musculoskeletal: Normal range of motion. She exhibits no edema or tenderness.  Lymphadenopathy:    She has no cervical adenopathy.  Neurological: She is alert and oriented to person, place, and time. She has normal reflexes. No cranial nerve deficit. Coordination normal.  Skin: Skin is warm and dry. No rash noted. She is not diaphoretic.  Psychiatric: Mood, memory and affect normal.       Assessment & Plan  Malignant neoplasm of female breast [facility]) Wears a prosthesis on right  Obstructive sleep apnea Uses CPAP intermittently  Hyperlipidemia Encouraged heart healthy diet, increase exercise, avoid trans fats, consider a krill oil cap daily  Hyperglycemia hgba1c acceptable, minimize simple carbs. Increase exercise as tolerated.   GERD (gastroesophageal reflux disease) Avoid offending foods, take probiotics. Do not eat large meals in late evening and consider raising head of bed.   Overweight Encouraged DASH diet, decrease po intake and increase exercise as tolerated. Needs 7-8 hours of sleep  nightly. Avoid trans fats, eat small, frequent meals every 4-5 hours with lean proteins, complex carbs and healthy fats. Minimize simple carbs, GMO foods.  Routine health maintenance Patient encouraged to maintain heart healthy diet, regular exercise, adequate sleep. Consider daily probiotics. Take medications as prescribed. Given and reviewed copy of ACP documents from [employer] and encouraged to complete and return

## 2014-11-23 NOTE — Assessment & Plan Note (Signed)
hgba1c acceptable, minimize simple carbs. Increase exercise as tolerated.  

## 2014-11-23 NOTE — Assessment & Plan Note (Signed)
Encouraged heart healthy diet, increase exercise, avoid trans fats, consider a krill oil cap daily 

## 2014-11-23 NOTE — Assessment & Plan Note (Signed)
Patient encouraged to maintain heart healthy diet, regular exercise, adequate sleep. Consider daily probiotics. Take medications as prescribed. Given and reviewed copy of ACP documents from [place] Secretary of State and encouraged to complete and return 

## 2014-11-23 NOTE — Assessment & Plan Note (Signed)
Encouraged DASH diet, decrease po intake and increase exercise as tolerated. Needs 7-8 hours of sleep nightly. Avoid trans fats, eat small, frequent meals every 4-5 hours with lean proteins, complex carbs and healthy fats. Minimize simple carbs, GMO foods. 

## 2014-11-25 ENCOUNTER — Encounter: Payer: Self-pay | Admitting: Family Medicine

## 2014-11-25 LAB — CYTOLOGY - PAP

## 2014-12-02 ENCOUNTER — Other Ambulatory Visit: Payer: Self-pay | Admitting: Obstetrics and Gynecology

## 2015-01-28 ENCOUNTER — Other Ambulatory Visit: Payer: Self-pay | Admitting: Family Medicine

## 2015-03-05 ENCOUNTER — Other Ambulatory Visit: Payer: Self-pay | Admitting: Family Medicine

## 2015-03-17 IMAGING — CR DG FINGER THUMB 2+V*R*
3 series · 3 of 3 positions shown · non-contrast
Comparison: None.

CLINICAL DATA: Persistent right thumb pain.

RIGHT THUMB 2+V

[view not recorded (1 of 3)]
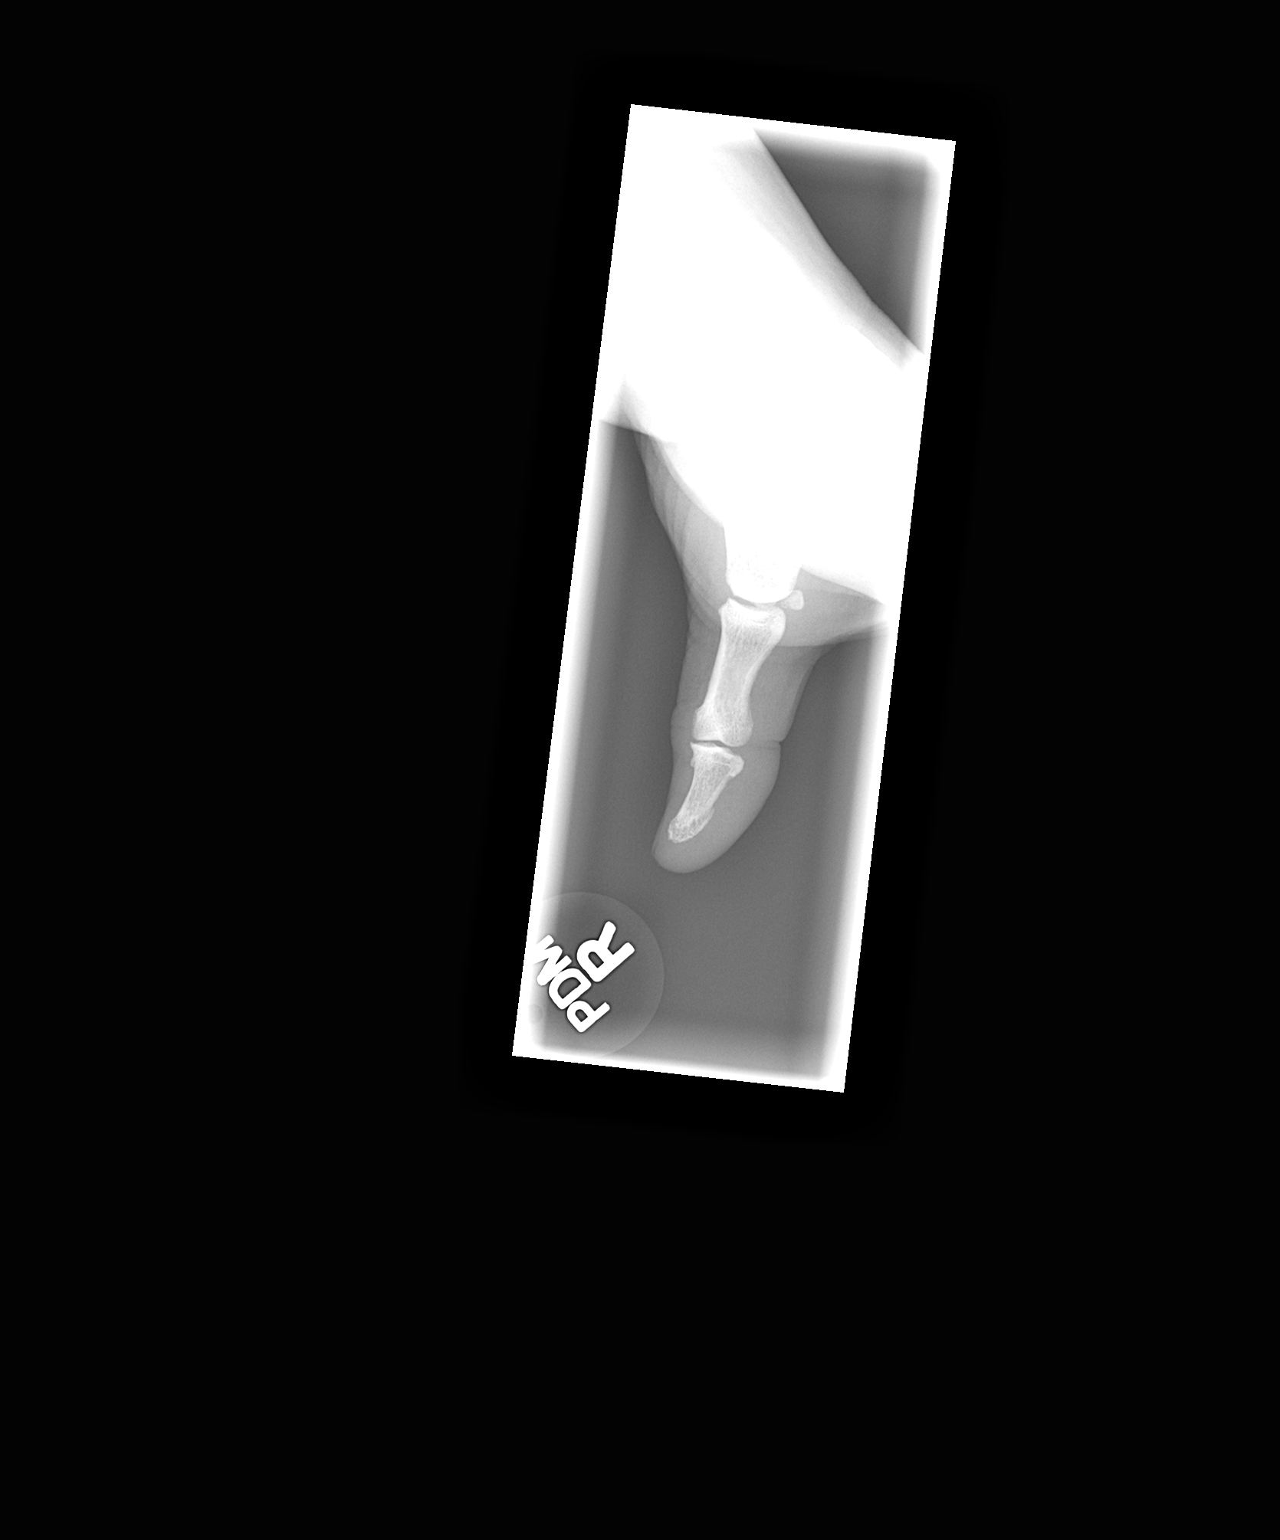

[view not recorded (2 of 3)]
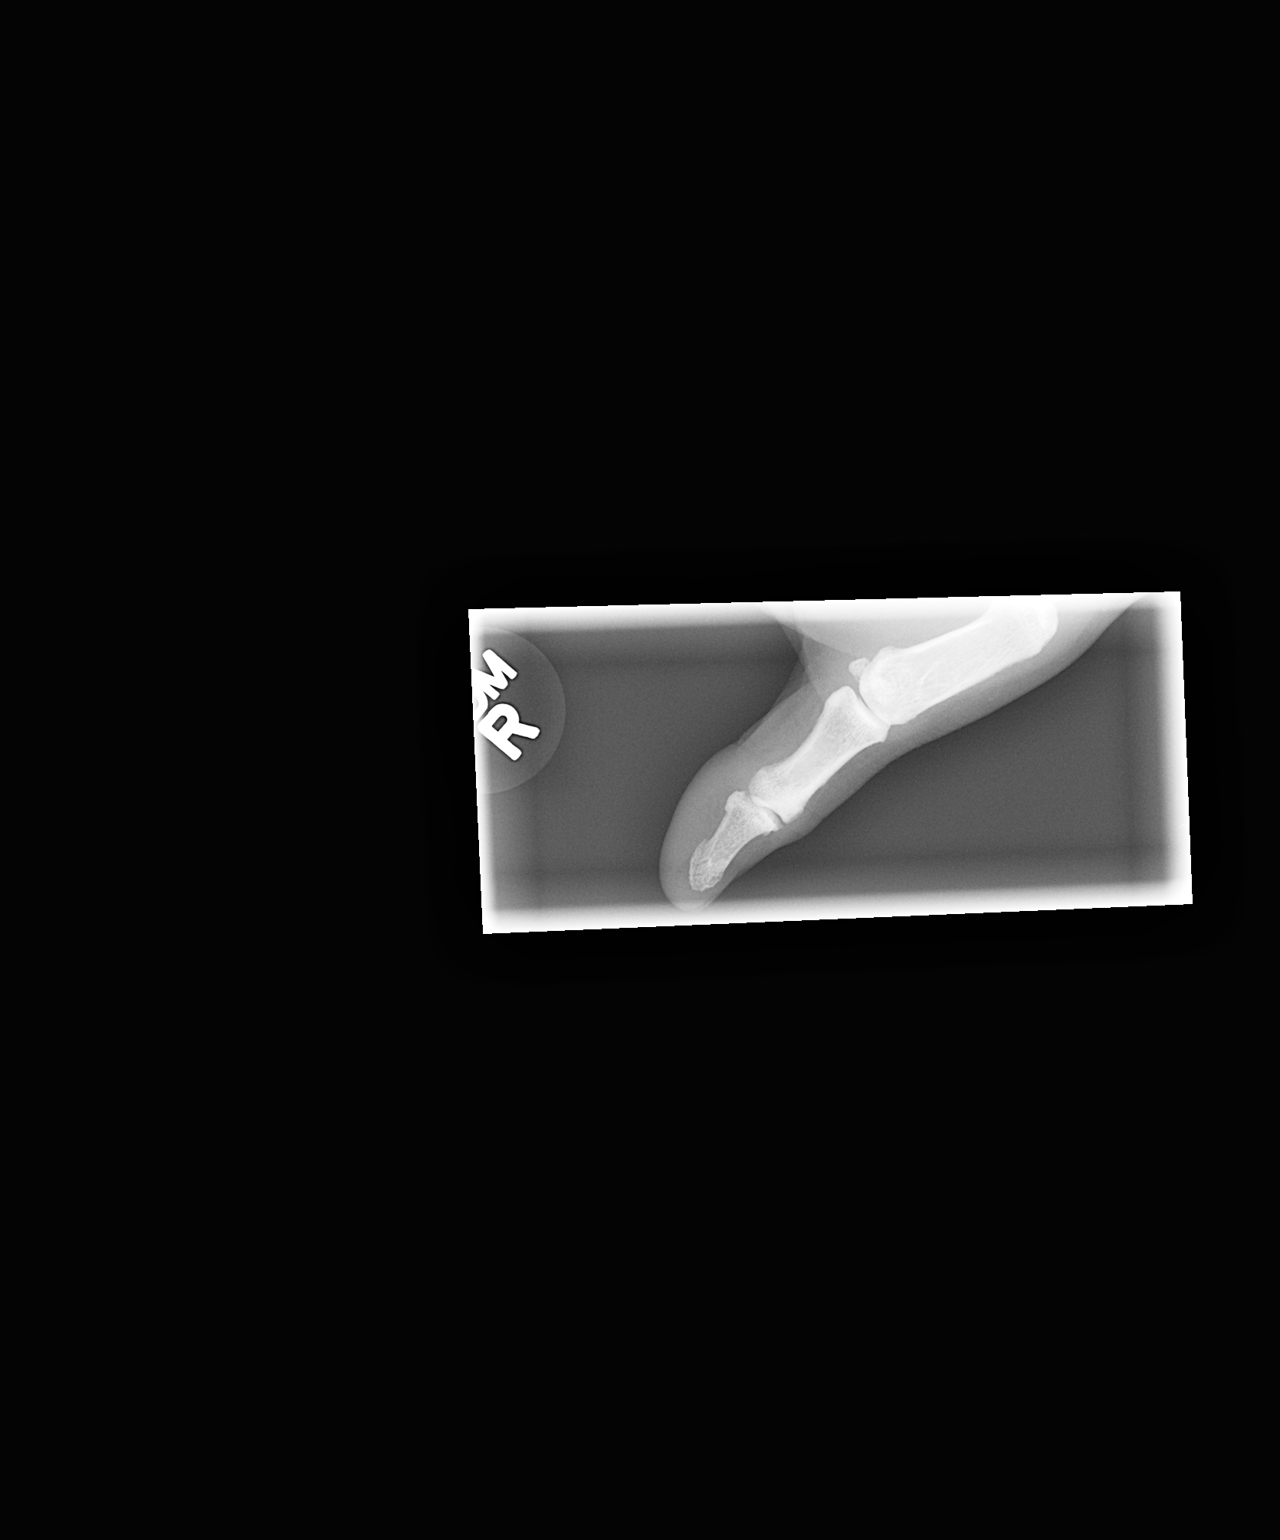

[view not recorded (3 of 3)]
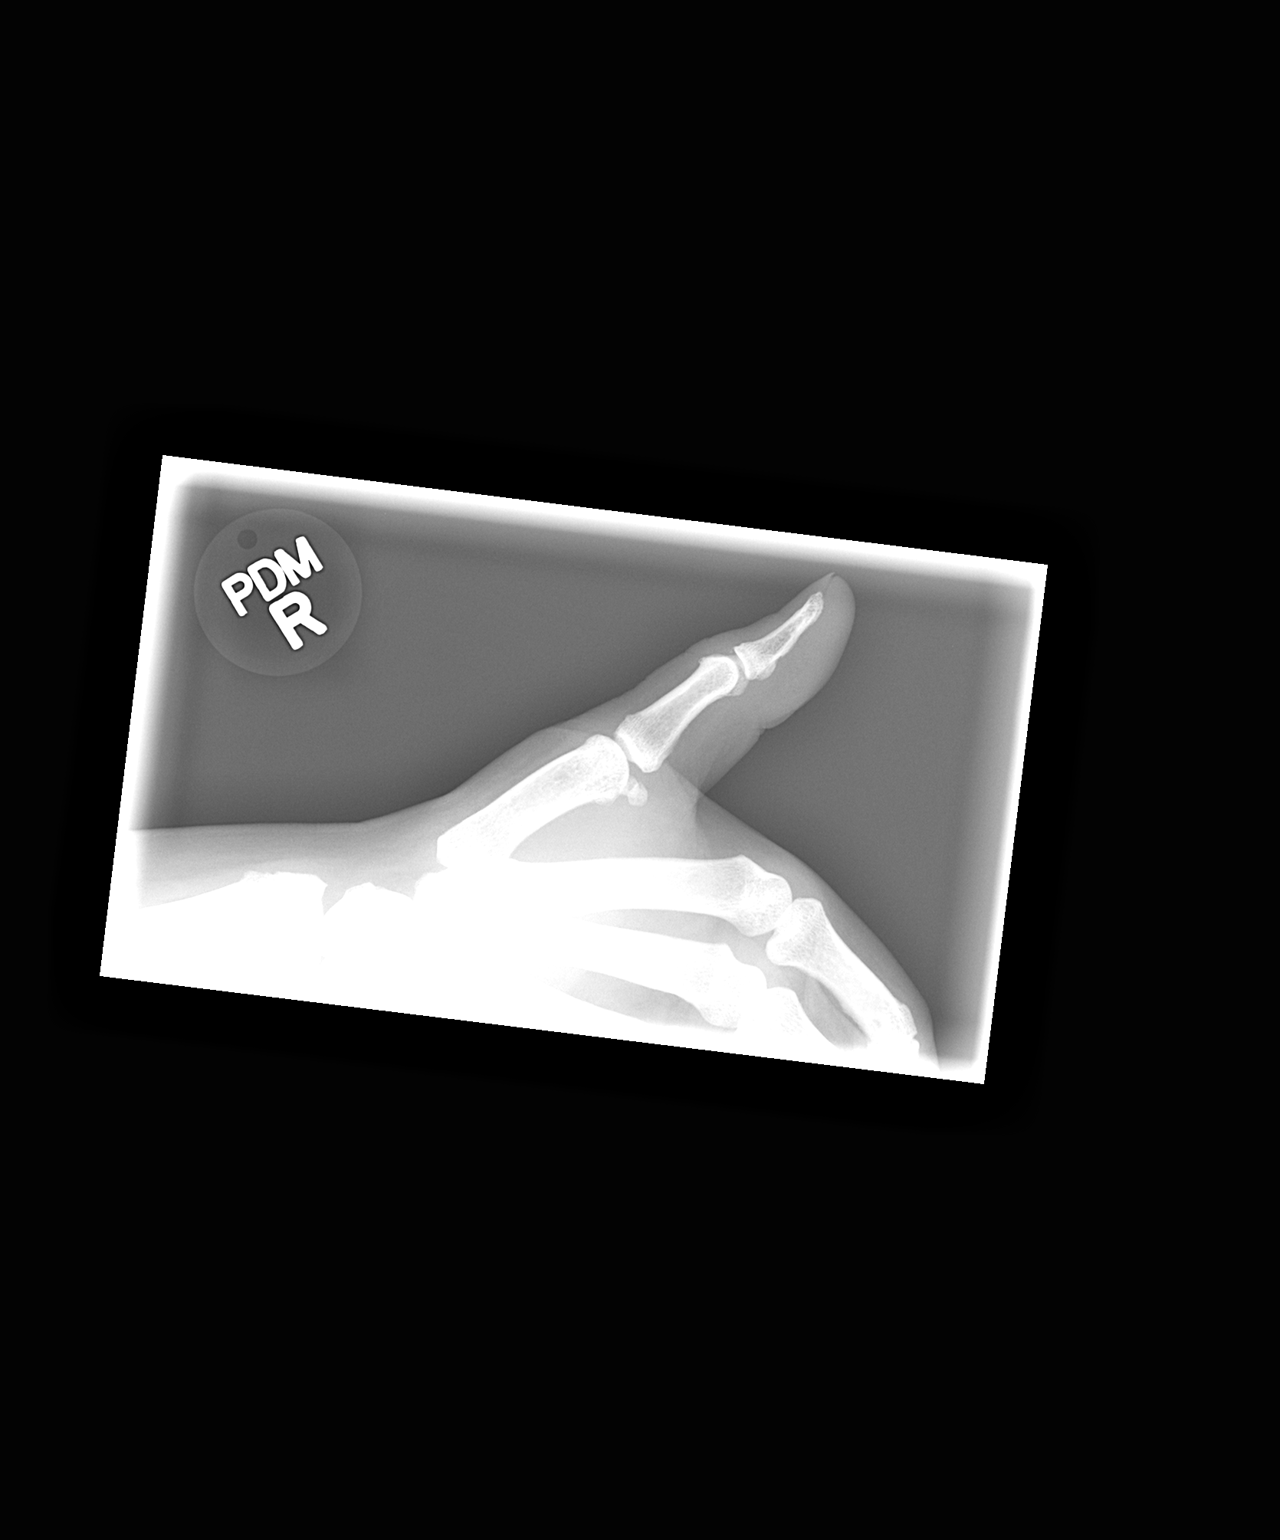

[3 of 3 positions shown; findings below may reference images not displayed]

FINDINGS: No fracture or dislocation.  There are mild degenerative
changes at the IP joint with minor joint space narrowing and small
marginal osteophytes.

The soft tissues are unremarkable.
IMPRESSION: No fracture or acute finding.  Mild osteoarthritis at the
interphalangeal joint.

## 2015-04-25 IMAGING — MG MM DIGITAL SCREENING UNILAT*L* W/ CAD
2 series · 2 of 2 positions shown · non-contrast
Comparison: Previous exam(s)

CLINICAL DATA: Screening.

EXAM:
LEFT DIGITAL SCREENING MAMMOGRAM WITH CAD

[L CC]
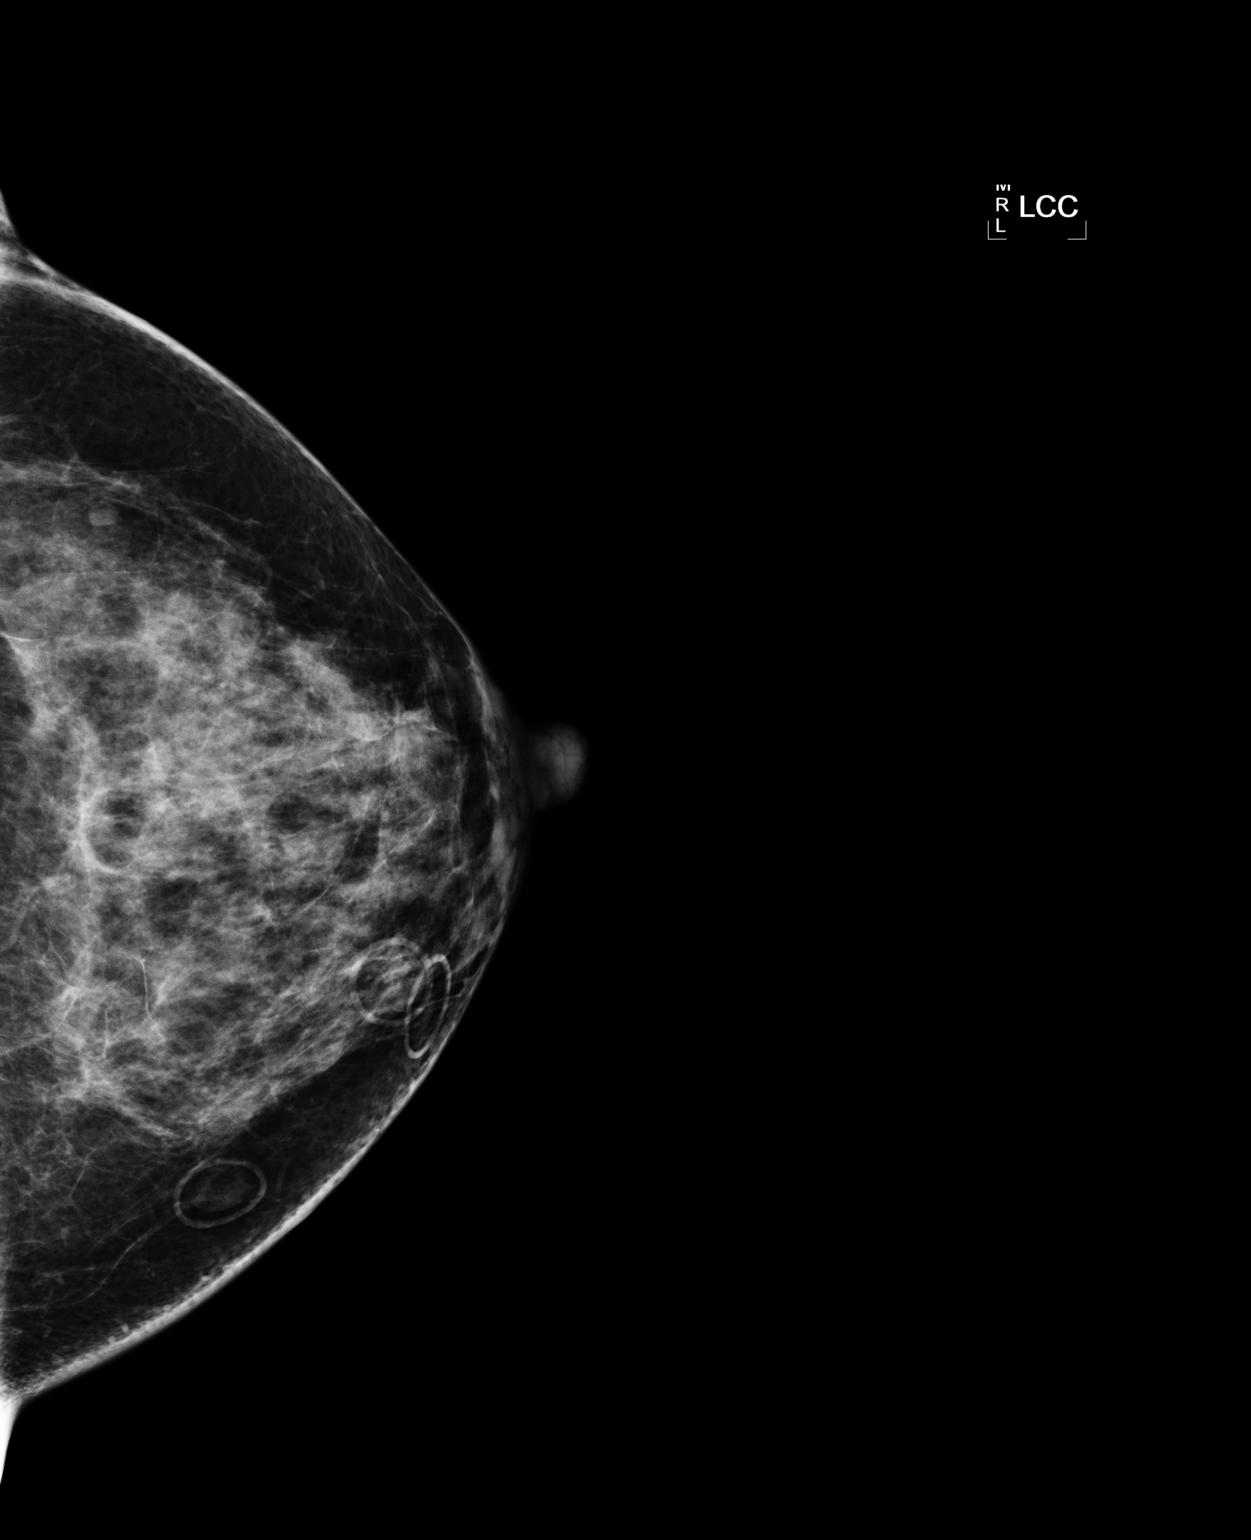

[L MLO]
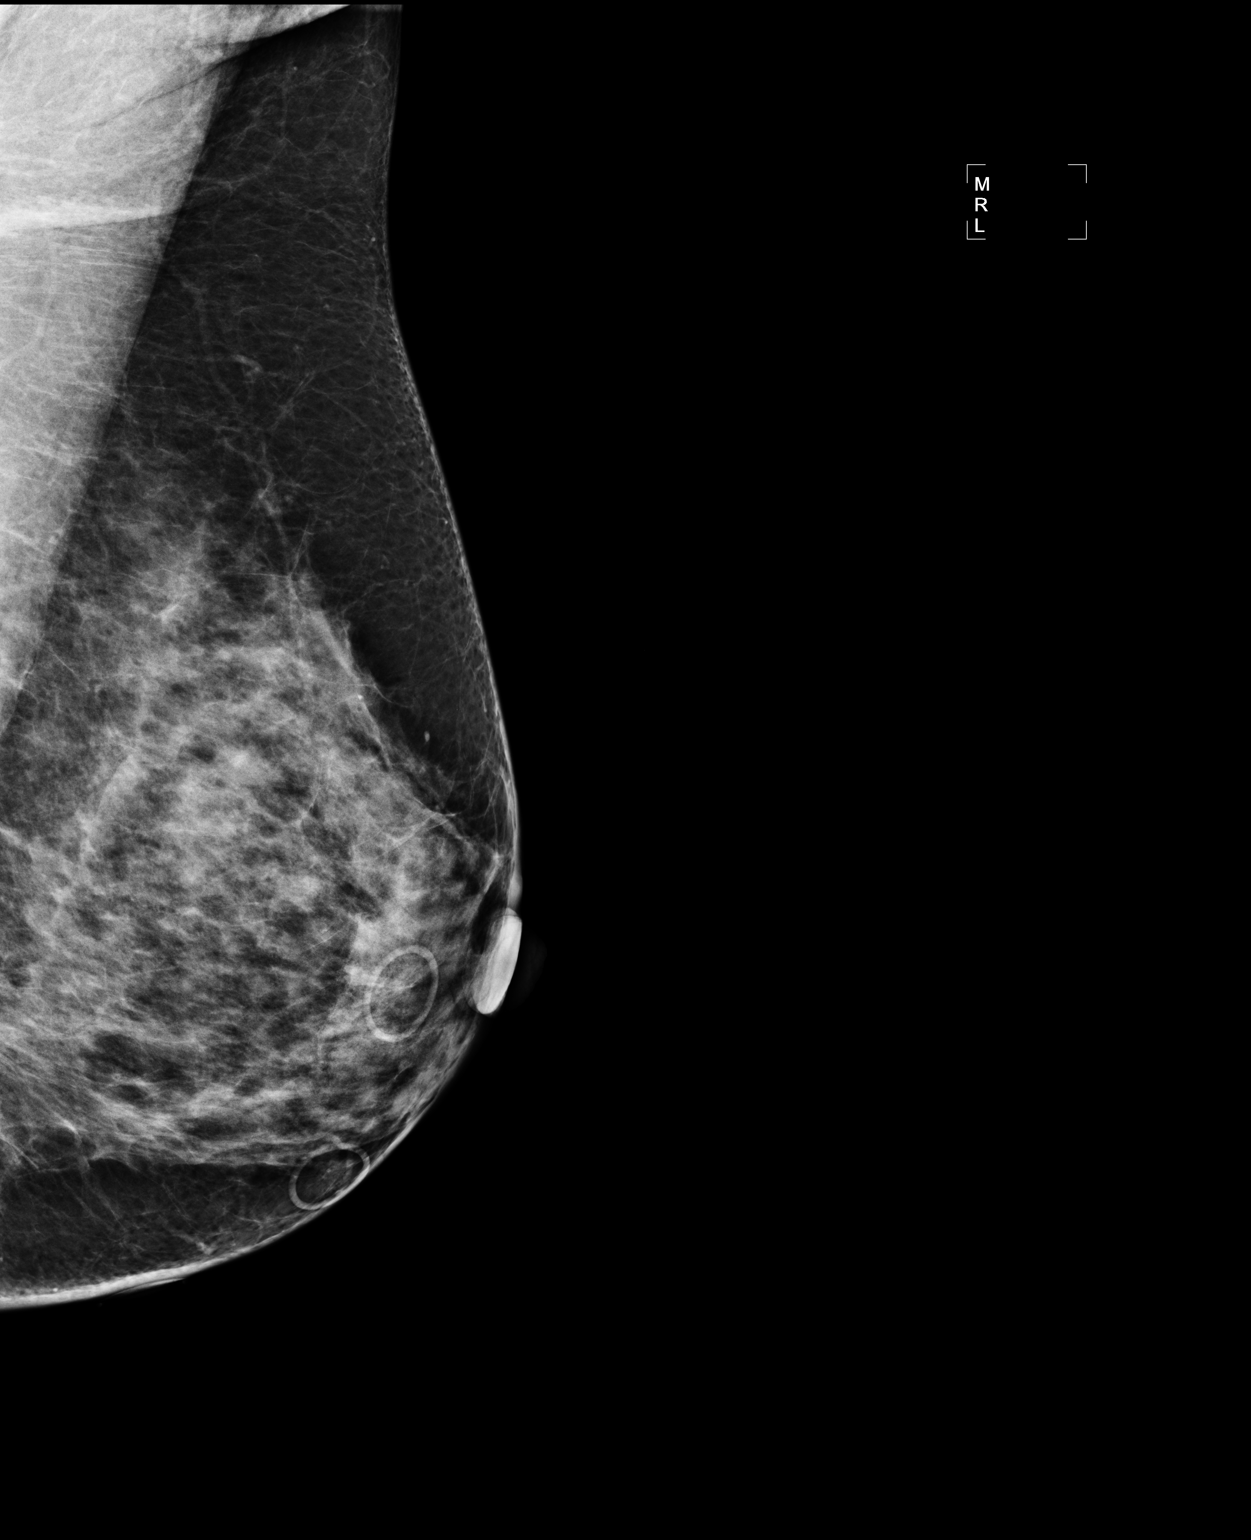

[2 of 2 positions shown; findings below may reference images not displayed]

ACR Breast Density Category c: The breasts tissue is heterogeneously
dense, which may obscure small masses.
FINDINGS: No suspicious masses, architectural distortion, or calcifications
are present. There are no findings suspicious for malignancy. The
patient has had a right mastectomy.

Images were processed with CAD.
IMPRESSION: No mammographic evidence of malignancy. A result letter of this
screening mammogram will be mailed directly to the patient.

RECOMMENDATION:
Screening mammogram in one year. (Code:U0-G-ETQ)

BI-RADS CATEGORY  1. Negative

## 2015-05-31 ENCOUNTER — Other Ambulatory Visit: Payer: Self-pay | Admitting: Family Medicine

## 2015-06-16 ENCOUNTER — Ambulatory Visit (INDEPENDENT_AMBULATORY_CARE_PROVIDER_SITE_OTHER): Payer: Managed Care, Other (non HMO) | Admitting: Medical

## 2015-06-16 ENCOUNTER — Telehealth: Payer: Self-pay

## 2015-06-16 ENCOUNTER — Other Ambulatory Visit (HOSPITAL_COMMUNITY)
Admission: RE | Admit: 2015-06-16 | Discharge: 2015-06-16 | Disposition: A | Discharge status: Home / Self Care | Payer: Managed Care, Other (non HMO) | Source: Ambulatory Visit | Attending: Medical | Admitting: Medical

## 2015-06-16 ENCOUNTER — Encounter: Payer: Self-pay | Admitting: Medical

## 2015-06-16 VITALS — BP 124/76 | HR 66 | Temp 97.8°F | Ht 65.0 in | Wt 176.2 lb

## 2015-06-16 DIAGNOSIS — N760 Acute vaginitis: Secondary | ICD-10-CM | POA: Insufficient documentation

## 2015-06-16 DIAGNOSIS — K0889 Other specified disorders of teeth and supporting structures: Secondary | ICD-10-CM

## 2015-06-16 DIAGNOSIS — R1030 Lower abdominal pain, unspecified: Secondary | ICD-10-CM | POA: Diagnosis not present

## 2015-06-16 DIAGNOSIS — J029 Acute pharyngitis, unspecified: Secondary | ICD-10-CM | POA: Diagnosis not present

## 2015-06-16 LAB — POC URINALSYSI DIPSTICK (AUTOMATED)
Bilirubin, UA: NEGATIVE
GLUCOSE UA: NEGATIVE
Ketones, UA: NEGATIVE
Leukocytes, UA: NEGATIVE
Nitrite, UA: NEGATIVE
PH UA: 7.5
Protein, UA: NEGATIVE
RBC UA: NEGATIVE
SPEC GRAV UA: 1.015
UROBILINOGEN UA: 0.2

## 2015-06-16 LAB — POCT RAPID STREP A (OFFICE): RAPID STREP A SCREEN: NEGATIVE

## 2015-06-16 MED ORDER — AMOXICILLIN-POT CLAVULANATE 875-125 MG PO TABS: 1.0000 | ORAL_TABLET | Freq: Two times a day (BID) | ORAL | Status: DC

## 2015-06-16 MED ORDER — FLUCONAZOLE 150 MG PO TABS: 150.0000 mg | ORAL_TABLET | Freq: Once | ORAL | Status: DC

## 2015-06-16 NOTE — Telephone Encounter (Signed)
Per patient request I faxed the office note to Dr. Simona Huh MD, OBGYN and I received a confirmation that all 8 pages has been faxed over to the GYN office on 06/16/15. Pt was advised by provider that the note would be faxed over and she voices understanding. //hsm,cma

## 2015-06-16 NOTE — Patient Instructions (Addendum)
For pharyngitis will rx augmentin. Your rapid strep test was negative but possible false negative result.  For bladder region will get urine culture and ancillary studies.  For your tooth pain will rx augmentin.(Keep dentist appointment). Recommend probiotics otc.  Rx diflucan for any yeast infection.  Follow up 7 days or as needed.

## 2015-06-16 NOTE — Progress Notes (Signed)
Subjective:    Patient ID: Chelsea Gordon, female    DOB: 08/23/1955, 60 y.o.   MRN: [id number]  HPI   Pt in with st that started over the weekend. Feels like is on fire. Pain on swallowing. Some pain left side submandibular. No fever, no chills or sweats. She works at [employer].  Pt also thinks maybe bladder infection. Slight discomfort over bladder. Describes some hesitant flow. Pt has been drinking some cranberry juice. Pt states gyn dip ua..   Rt upper back tooth also mild tender. Pt has filling. Some pain.      Review of Systems  Constitutional: Negative for fever, chills and fatigue.  HENT: Positive for postnasal drip and sore throat. Negative for congestion, ear pain, sinus pressure and sneezing.   Eyes: Negative for itching.  Respiratory: Negative for cough, chest tightness, shortness of breath and wheezing.   Cardiovascular: Negative for chest pain and palpitations.  Gastrointestinal: Negative for abdominal pain.  Genitourinary: Negative for urgency, flank pain, difficulty urinating, vaginal pain and pelvic pain.       Hesitant urine flow.  Musculoskeletal: Negative for back pain.  Skin: Negative for pallor.  Neurological: Negative for dizziness and light-headedness.  Hematological: Negative for adenopathy. Does not bruise/bleed easily.  Psychiatric/Behavioral: Negative for behavioral problems and confusion.    Past Medical History  Diagnosis Date  . External hemorrhoids   . Other dyspnea and respiratory abnormality   . External thrombosed hemorrhoids   . Other postprocedural status(V45.89)   . Chronic back pain   . Allergic rhinitis   . History of carpal tunnel syndrome   . Chicken pox as a child  . Anemia 11/29/2013  . Overweight 11/29/2013  . GERD (gastroesophageal reflux disease) 11/14/2014  . Allergic rhinitis 11/14/2014  . Preventative health care 11/23/2014     Social History   Social History  . Marital Status: Married    Spouse Name: N/A  . Number  of Children: 2  . Years of Education: 16   Occupational History  . banking    Social History Main Topics  . Smoking status: Never Smoker   . Smokeless tobacco: Never Used  . Alcohol Use: Yes  . Drug Use: No  . Sexual Activity:    Partners: Male   Other Topics Concern  . 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 Nov '12 - tough year - SO w/ CVA,  dtr's ill, work with many changes and increased stress. Jan '14 - Daughter Jinny Blossom - home. She has had some issues and change in behavior. Stressful for both parents.    Past Surgical History  Procedure Laterality Date  . Mastectomy,right    . Tubal ligation    . Hemorrhoid surgery    . Dilation and curettage of uterus      Family History  Problem Relation Age of Onset  . Cancer Father     lung- smoke  . Heart disease Maternal Grandfather   . Cancer Maternal Grandfather     prostate  . Cancer Paternal Uncle     Prostate cancer  . Diabetes Mother     type 2  . Sarcoidosis Mother     in sinuses, spreading  . Thyroid disease Mother   . Asthma Mother   . Hyperlipidemia Mother   . Hepatitis Sister   . Diabetes Brother   . Colitis Daughter 25    alcerative  .  Anemia Daughter     No Known Allergies  Current Outpatient Prescriptions on File Prior to Visit  Medication Sig Dispense Refill  . ibuprofen (ADVIL,MOTRIN) 800 MG tablet TAKE 1/2 TABLET BY MOUTH EVERY 8 HOURS AS NEEDED 90 tablet 0   No current facility-administered medications on file prior to visit.    BP 124/76 mmHg  Pulse 66  Temp(Src) 97.8 F (36.6 C) (Oral)  Ht 5\' 5"  (1.651 m)  Wt 176 lb 3.2 oz (79.924 kg)  BMI 29.32 kg/m2  SpO2 98%  LMP 05/10/2014       Objective:   Physical Exam   General  Mental Status - Alert. General Appearance - Well groomed. Not in acute distress.  Skin Rashes- No Rashes.  HEENT Head- Normal. Ear Auditory Canal - Left- Normal. Right -  Normal.Tympanic Membrane- Left- Normal. Right- Normal. Eye Sclera/Conjunctiva- Left- Normal. Right- Normal. Nose & Sinuses Nasal Mucosa- Left-  Boggy and Congested. Right-  Boggy and  Congested.Bilateral maxillary and frontal sinus pressure. Mouth & Throat Lips: Upper Lip- Normal: no dryness, cracking, pallor, cyanosis, or vesicular eruption. Lower Lip-Normal: no dryness, cracking, pallor, cyanosis or vesicular eruption. Buccal Mucosa- Bilateral- No Aphthous ulcers. Oropharynx- No Discharge or Erythema. Tonsils: Characteristics- Bilateral- Erythema and  Congestion. Size/Enlargement- Bilateral- No enlargement. Discharge- bilateral-None.  Mouth- rt side upper teeth. Back side tooth filling present(pt states tooth hurts some on eating)  Neck Neck- Supple. No Masses. Mild bilateral submandibular node tenderness   Chest and Lung Exam Auscultation: Breath Sounds:-Clear even and unlabored.  Cardiovascular Auscultation:Rythm- Regular, rate and rhythm. Murmurs & Other Heart Sounds:Ausculatation of the heart reveal- No Murmurs.  Lymphatic Head & Neck General Head & Neck Lymphatics: Bilateral: Description- No Localized lymphadenopathy.   Abdomen soft, nt, nd, +bs, no rebound tenderness. No suprapubic tenderness.  Back- no cva tenderness.     Assessment & Plan:  For pharyngitis will rx augmentin. Your rapid strep test was negative but possible false negative result.  For bladder region will get urine culture and ancillary studies.  For your tooth pain will rx augmentin.(Keep dentist appointment). Recommend probiotics otc.  Rx diflucan for any yeast infection.  Follow up 7 days or as needed.  Pt request that we send this note to [phone]. Dr. Margie Billet,

## 2015-06-16 NOTE — Progress Notes (Signed)
Pre visit review using our clinic review tool, if applicable. No additional management support is needed unless otherwise documented below in the visit note. 

## 2015-06-16 NOTE — Addendum Note (Signed)
Addended by: Tasia Catchings on: 06/16/2015 08:58 AM   Modules accepted: Orders

## 2015-06-16 NOTE — Addendum Note (Signed)
Addended by: Anabel Halon on: 06/16/2015 08:47 AM   Modules accepted: Miquel Dunn

## 2015-06-18 LAB — URINE CULTURE
Colony Count: NO GROWTH
ORGANISM ID, BACTERIA: NO GROWTH

## 2015-06-19 LAB — URINE CYTOLOGY ANCILLARY ONLY
BACTERIAL VAGINITIS: NEGATIVE
CANDIDA VAGINITIS: NEGATIVE

## 2015-07-24 ENCOUNTER — Encounter: Payer: Self-pay | Admitting: Genetic Counselor

## 2015-07-30 ENCOUNTER — Other Ambulatory Visit: Payer: Self-pay | Admitting: Family Medicine

## 2015-07-30 MED ORDER — IBUPROFEN 800 MG PO TABS: ORAL_TABLET | ORAL | Status: DC

## 2015-08-14 ENCOUNTER — Telehealth: Payer: Self-pay

## 2015-08-14 NOTE — Telephone Encounter (Signed)
Pt called this morning and spoke with Shaquita J. At the front desk and she stated that she wanted a copy of the results. I printed off all the labs from the 06/16/15 office visit with E. Saguier put them in the folder that is up front for the patients husband to pick up.

## 2015-08-19 ENCOUNTER — Telehealth: Payer: Self-pay

## 2015-08-19 NOTE — Telephone Encounter (Signed)
Sent to charge correction for billing provider to be changed to [place] Hom[place] and [place] will follow up on lab bill and reach out to patient

## 2015-10-16 NOTE — Telephone Encounter (Signed)
Patient called wanting an update on the Lab bill from her visit. I called [facility] and was informed that we could not make any adjustments to the bill the only thing we could do is pay the bill and that patient is on a payment plan. I called patient and left voicemail to return my call for update

## 2015-10-19 NOTE — Telephone Encounter (Signed)
Sent email to Hospital account billing manager to charge balance to our call center waiting to her back from herr.

## 2015-10-21 NOTE — Telephone Encounter (Signed)
Pt called in returning call. Updated pt on message below. Pt says that it is okay to leave a voicemail when follow up call back.    Thanks.

## 2015-11-13 ENCOUNTER — Encounter: Payer: Managed Care, Other (non HMO) | Admitting: Family Medicine

## 2015-11-20 ENCOUNTER — Encounter: Payer: Managed Care, Other (non HMO) | Admitting: Family Medicine

## 2015-12-30 ENCOUNTER — Other Ambulatory Visit: Payer: Self-pay | Admitting: Obstetrics and Gynecology

## 2015-12-30 ENCOUNTER — Other Ambulatory Visit (HOSPITAL_COMMUNITY)
Admission: RE | Admit: 2015-12-30 | Discharge: 2015-12-30 | Disposition: A | Discharge status: Home / Self Care | Payer: Managed Care, Other (non HMO) | Source: Ambulatory Visit | Attending: Obstetrics and Gynecology | Admitting: Obstetrics and Gynecology

## 2015-12-30 DIAGNOSIS — Z1151 Encounter for screening for human papillomavirus (HPV): Secondary | ICD-10-CM | POA: Insufficient documentation

## 2015-12-31 ENCOUNTER — Encounter: Payer: Managed Care, Other (non HMO) | Admitting: Family Medicine

## 2015-12-31 LAB — CERVICOVAGINAL ANCILLARY ONLY: HPV: NOT DETECTED

## 2016-11-07 ENCOUNTER — Encounter: Payer: Self-pay | Admitting: Family Medicine

## 2016-12-05 ENCOUNTER — Encounter: Payer: Self-pay | Admitting: Gastroenterology

## 2018-01-08 ENCOUNTER — Other Ambulatory Visit: Payer: Self-pay | Admitting: Obstetrics and Gynecology

## 2018-01-08 ENCOUNTER — Other Ambulatory Visit (HOSPITAL_COMMUNITY)
Admission: RE | Admit: 2018-01-08 | Discharge: 2018-01-08 | Disposition: A | Discharge status: Home / Self Care | Payer: Managed Care, Other (non HMO) | Source: Ambulatory Visit | Attending: Obstetrics and Gynecology | Admitting: Obstetrics and Gynecology

## 2018-01-08 DIAGNOSIS — Z01411 Encounter for gynecological examination (general) (routine) with abnormal findings: Secondary | ICD-10-CM | POA: Diagnosis not present

## 2018-01-11 LAB — CYTOLOGY - PAP
Diagnosis: NEGATIVE
HPV (WINDOPATH): NOT DETECTED

## 2019-09-12 ENCOUNTER — Encounter: Payer: Self-pay | Admitting: Genetic Counselor

## 2022-04-12 DIAGNOSIS — L708 Other acne: Secondary | ICD-10-CM | POA: Diagnosis not present

## 2022-05-04 DIAGNOSIS — M1990 Unspecified osteoarthritis, unspecified site: Secondary | ICD-10-CM | POA: Diagnosis not present

## 2022-05-16 DIAGNOSIS — M25562 Pain in left knee: Secondary | ICD-10-CM | POA: Diagnosis not present

## 2022-05-16 DIAGNOSIS — M5459 Other low back pain: Secondary | ICD-10-CM | POA: Diagnosis not present

## 2022-05-16 DIAGNOSIS — M25561 Pain in right knee: Secondary | ICD-10-CM | POA: Diagnosis not present

## 2022-05-30 DIAGNOSIS — M25562 Pain in left knee: Secondary | ICD-10-CM | POA: Diagnosis not present

## 2022-05-30 DIAGNOSIS — M5459 Other low back pain: Secondary | ICD-10-CM | POA: Diagnosis not present

## 2022-05-30 DIAGNOSIS — M25561 Pain in right knee: Secondary | ICD-10-CM | POA: Diagnosis not present

## 2022-06-06 DIAGNOSIS — M25561 Pain in right knee: Secondary | ICD-10-CM | POA: Diagnosis not present

## 2022-06-06 DIAGNOSIS — M5459 Other low back pain: Secondary | ICD-10-CM | POA: Diagnosis not present

## 2022-06-06 DIAGNOSIS — M25562 Pain in left knee: Secondary | ICD-10-CM | POA: Diagnosis not present

## 2022-06-08 DIAGNOSIS — M25562 Pain in left knee: Secondary | ICD-10-CM | POA: Diagnosis not present

## 2022-06-08 DIAGNOSIS — M25561 Pain in right knee: Secondary | ICD-10-CM | POA: Diagnosis not present

## 2022-06-08 DIAGNOSIS — M5459 Other low back pain: Secondary | ICD-10-CM | POA: Diagnosis not present

## 2022-06-13 DIAGNOSIS — M25561 Pain in right knee: Secondary | ICD-10-CM | POA: Diagnosis not present

## 2022-06-13 DIAGNOSIS — M25562 Pain in left knee: Secondary | ICD-10-CM | POA: Diagnosis not present

## 2022-06-13 DIAGNOSIS — M5459 Other low back pain: Secondary | ICD-10-CM | POA: Diagnosis not present

## 2022-06-15 DIAGNOSIS — M5459 Other low back pain: Secondary | ICD-10-CM | POA: Diagnosis not present

## 2022-06-15 DIAGNOSIS — M25562 Pain in left knee: Secondary | ICD-10-CM | POA: Diagnosis not present

## 2022-06-15 DIAGNOSIS — M25561 Pain in right knee: Secondary | ICD-10-CM | POA: Diagnosis not present

## 2022-06-20 DIAGNOSIS — M25562 Pain in left knee: Secondary | ICD-10-CM | POA: Diagnosis not present

## 2022-06-20 DIAGNOSIS — M5459 Other low back pain: Secondary | ICD-10-CM | POA: Diagnosis not present

## 2022-06-20 DIAGNOSIS — M25561 Pain in right knee: Secondary | ICD-10-CM | POA: Diagnosis not present

## 2022-06-22 DIAGNOSIS — M25561 Pain in right knee: Secondary | ICD-10-CM | POA: Diagnosis not present

## 2022-06-22 DIAGNOSIS — M5459 Other low back pain: Secondary | ICD-10-CM | POA: Diagnosis not present

## 2022-06-22 DIAGNOSIS — M25562 Pain in left knee: Secondary | ICD-10-CM | POA: Diagnosis not present

## 2022-06-29 DIAGNOSIS — M5459 Other low back pain: Secondary | ICD-10-CM | POA: Diagnosis not present

## 2022-06-29 DIAGNOSIS — M25562 Pain in left knee: Secondary | ICD-10-CM | POA: Diagnosis not present

## 2022-06-29 DIAGNOSIS — M25561 Pain in right knee: Secondary | ICD-10-CM | POA: Diagnosis not present

## 2022-07-05 DIAGNOSIS — H52203 Unspecified astigmatism, bilateral: Secondary | ICD-10-CM | POA: Diagnosis not present

## 2022-07-05 DIAGNOSIS — H2513 Age-related nuclear cataract, bilateral: Secondary | ICD-10-CM | POA: Diagnosis not present

## 2022-07-05 DIAGNOSIS — H25813 Combined forms of age-related cataract, bilateral: Secondary | ICD-10-CM | POA: Diagnosis not present

## 2022-07-05 DIAGNOSIS — H524 Presbyopia: Secondary | ICD-10-CM | POA: Diagnosis not present

## 2022-10-06 DIAGNOSIS — C50911 Malignant neoplasm of unspecified site of right female breast: Secondary | ICD-10-CM | POA: Diagnosis not present

## 2022-11-22 DIAGNOSIS — Z01419 Encounter for gynecological examination (general) (routine) without abnormal findings: Secondary | ICD-10-CM | POA: Diagnosis not present

## 2022-11-22 DIAGNOSIS — D259 Leiomyoma of uterus, unspecified: Secondary | ICD-10-CM | POA: Diagnosis not present

## 2022-11-22 DIAGNOSIS — D229 Melanocytic nevi, unspecified: Secondary | ICD-10-CM | POA: Diagnosis not present

## 2022-11-22 DIAGNOSIS — R102 Pelvic and perineal pain: Secondary | ICD-10-CM | POA: Diagnosis not present

## 2022-11-22 DIAGNOSIS — Z124 Encounter for screening for malignant neoplasm of cervix: Secondary | ICD-10-CM | POA: Diagnosis not present

## 2022-11-23 DIAGNOSIS — Z1231 Encounter for screening mammogram for malignant neoplasm of breast: Secondary | ICD-10-CM | POA: Diagnosis not present

## 2022-11-24 DIAGNOSIS — D219 Benign neoplasm of connective and other soft tissue, unspecified: Secondary | ICD-10-CM | POA: Diagnosis not present

## 2022-11-29 DIAGNOSIS — Z0000 Encounter for general adult medical examination without abnormal findings: Secondary | ICD-10-CM | POA: Diagnosis not present

## 2022-11-29 DIAGNOSIS — E7800 Pure hypercholesterolemia, unspecified: Secondary | ICD-10-CM | POA: Diagnosis not present

## 2022-11-29 DIAGNOSIS — R070 Pain in throat: Secondary | ICD-10-CM | POA: Diagnosis not present

## 2022-11-29 DIAGNOSIS — E669 Obesity, unspecified: Secondary | ICD-10-CM | POA: Diagnosis not present

## 2022-11-29 DIAGNOSIS — R0683 Snoring: Secondary | ICD-10-CM | POA: Diagnosis not present

## 2022-12-12 DIAGNOSIS — I10 Essential (primary) hypertension: Secondary | ICD-10-CM | POA: Diagnosis not present

## 2022-12-12 DIAGNOSIS — R0683 Snoring: Secondary | ICD-10-CM | POA: Diagnosis not present

## 2022-12-12 DIAGNOSIS — R059 Cough, unspecified: Secondary | ICD-10-CM | POA: Diagnosis not present

## 2022-12-12 DIAGNOSIS — Z20822 Contact with and (suspected) exposure to covid-19: Secondary | ICD-10-CM | POA: Diagnosis not present

## 2022-12-16 DIAGNOSIS — R070 Pain in throat: Secondary | ICD-10-CM | POA: Diagnosis not present

## 2022-12-28 DIAGNOSIS — G4733 Obstructive sleep apnea (adult) (pediatric): Secondary | ICD-10-CM | POA: Diagnosis not present

## 2023-01-12 DIAGNOSIS — L820 Inflamed seborrheic keratosis: Secondary | ICD-10-CM | POA: Diagnosis not present

## 2023-01-12 DIAGNOSIS — L538 Other specified erythematous conditions: Secondary | ICD-10-CM | POA: Diagnosis not present

## 2023-01-12 DIAGNOSIS — L821 Other seborrheic keratosis: Secondary | ICD-10-CM | POA: Diagnosis not present

## 2023-01-12 DIAGNOSIS — R208 Other disturbances of skin sensation: Secondary | ICD-10-CM | POA: Diagnosis not present

## 2023-01-12 DIAGNOSIS — Z789 Other specified health status: Secondary | ICD-10-CM | POA: Diagnosis not present

## 2023-01-12 DIAGNOSIS — L2989 Other pruritus: Secondary | ICD-10-CM | POA: Diagnosis not present

## 2023-01-12 DIAGNOSIS — G4733 Obstructive sleep apnea (adult) (pediatric): Secondary | ICD-10-CM | POA: Diagnosis not present

## 2023-03-17 ENCOUNTER — Other Ambulatory Visit (HOSPITAL_COMMUNITY): Payer: Self-pay | Admitting: Family Medicine

## 2023-03-17 DIAGNOSIS — E7800 Pure hypercholesterolemia, unspecified: Secondary | ICD-10-CM

## 2023-03-20 ENCOUNTER — Ambulatory Visit (HOSPITAL_COMMUNITY)
Admission: RE | Admit: 2023-03-20 | Discharge: 2023-03-20 | Disposition: A | Discharge status: Home / Self Care | Payer: Self-pay | Source: Ambulatory Visit | Attending: Family Medicine | Admitting: Family Medicine

## 2023-03-20 DIAGNOSIS — E7800 Pure hypercholesterolemia, unspecified: Secondary | ICD-10-CM | POA: Insufficient documentation

## 2023-04-12 DIAGNOSIS — G4733 Obstructive sleep apnea (adult) (pediatric): Secondary | ICD-10-CM | POA: Diagnosis not present

## 2023-05-13 DIAGNOSIS — G4733 Obstructive sleep apnea (adult) (pediatric): Secondary | ICD-10-CM | POA: Diagnosis not present

## 2023-05-15 DIAGNOSIS — G4733 Obstructive sleep apnea (adult) (pediatric): Secondary | ICD-10-CM | POA: Diagnosis not present

## 2023-06-12 DIAGNOSIS — G4733 Obstructive sleep apnea (adult) (pediatric): Secondary | ICD-10-CM | POA: Diagnosis not present

## 2023-07-13 DIAGNOSIS — G4733 Obstructive sleep apnea (adult) (pediatric): Secondary | ICD-10-CM | POA: Diagnosis not present

## 2023-07-26 DIAGNOSIS — I10 Essential (primary) hypertension: Secondary | ICD-10-CM | POA: Diagnosis not present

## 2023-07-31 DIAGNOSIS — M1990 Unspecified osteoarthritis, unspecified site: Secondary | ICD-10-CM | POA: Diagnosis not present

## 2023-07-31 DIAGNOSIS — E669 Obesity, unspecified: Secondary | ICD-10-CM | POA: Diagnosis not present

## 2023-07-31 DIAGNOSIS — M79672 Pain in left foot: Secondary | ICD-10-CM | POA: Diagnosis not present

## 2023-07-31 DIAGNOSIS — Z0289 Encounter for other administrative examinations: Secondary | ICD-10-CM | POA: Diagnosis not present

## 2023-07-31 DIAGNOSIS — E7800 Pure hypercholesterolemia, unspecified: Secondary | ICD-10-CM | POA: Diagnosis not present

## 2023-07-31 DIAGNOSIS — J988 Other specified respiratory disorders: Secondary | ICD-10-CM | POA: Diagnosis not present

## 2023-07-31 DIAGNOSIS — I10 Essential (primary) hypertension: Secondary | ICD-10-CM | POA: Diagnosis not present

## 2023-08-12 DIAGNOSIS — G4733 Obstructive sleep apnea (adult) (pediatric): Secondary | ICD-10-CM | POA: Diagnosis not present

## 2023-08-17 ENCOUNTER — Ambulatory Visit: Admitting: Podiatry

## 2023-08-17 ENCOUNTER — Other Ambulatory Visit: Payer: Self-pay | Admitting: Podiatry

## 2023-08-17 ENCOUNTER — Ambulatory Visit (INDEPENDENT_AMBULATORY_CARE_PROVIDER_SITE_OTHER)

## 2023-08-17 ENCOUNTER — Encounter: Payer: Self-pay | Admitting: Podiatry

## 2023-08-17 DIAGNOSIS — M2011 Hallux valgus (acquired), right foot: Secondary | ICD-10-CM

## 2023-08-17 DIAGNOSIS — Z9011 Acquired absence of right breast and nipple: Secondary | ICD-10-CM | POA: Insufficient documentation

## 2023-08-17 DIAGNOSIS — N950 Postmenopausal bleeding: Secondary | ICD-10-CM | POA: Insufficient documentation

## 2023-08-17 DIAGNOSIS — K5900 Constipation, unspecified: Secondary | ICD-10-CM | POA: Insufficient documentation

## 2023-08-17 DIAGNOSIS — M19072 Primary osteoarthritis, left ankle and foot: Secondary | ICD-10-CM

## 2023-08-17 DIAGNOSIS — R7303 Prediabetes: Secondary | ICD-10-CM | POA: Insufficient documentation

## 2023-08-17 DIAGNOSIS — E2839 Other primary ovarian failure: Secondary | ICD-10-CM | POA: Insufficient documentation

## 2023-08-17 DIAGNOSIS — M1990 Unspecified osteoarthritis, unspecified site: Secondary | ICD-10-CM | POA: Insufficient documentation

## 2023-08-17 DIAGNOSIS — M2012 Hallux valgus (acquired), left foot: Secondary | ICD-10-CM | POA: Diagnosis not present

## 2023-08-17 DIAGNOSIS — E669 Obesity, unspecified: Secondary | ICD-10-CM | POA: Insufficient documentation

## 2023-08-17 DIAGNOSIS — I77810 Thoracic aortic ectasia: Secondary | ICD-10-CM | POA: Insufficient documentation

## 2023-08-17 DIAGNOSIS — L723 Sebaceous cyst: Secondary | ICD-10-CM | POA: Insufficient documentation

## 2023-08-17 MED ORDER — MELOXICAM 15 MG PO TABS
15.0000 mg | ORAL_TABLET | Freq: Every day | ORAL | 3 refills | Status: AC
Start: 2023-08-17 — End: ?

## 2023-08-17 MED ORDER — METHYLPREDNISOLONE 4 MG PO TBPK: ORAL_TABLET | ORAL | 0 refills | Status: AC

## 2023-08-17 NOTE — Progress Notes (Signed)
 Subjective:  Patient ID: Chelsea Gordon, female    DOB: [birth year],  MRN: [id number] HPI Chief Complaint  Patient presents with   Foot Pain    1st MPJ left - fell Mother's Day weekend - stepped off the curb after brunch and fell, not sure if she bumped or bent foot back, still quite tender, did have xrays done, but report was not gone over with her, she was referred here for treatment.    New Patient (Initial Visit)    68 y.o. female presents with the above complaint.   ROS: Denies fever chills (muscle aches pains calf pain back pain chest pain shortness of breath.  Past Medical History:  Diagnosis Date   Allergic rhinitis    Allergic rhinitis 11/14/2014   Anemia 11/29/2013   Chicken pox as a child   Chronic back pain    External hemorrhoids    External thrombosed hemorrhoids    GERD (gastroesophageal reflux disease) 11/14/2014   History of carpal tunnel syndrome    Other dyspnea and respiratory abnormality    Other postprocedural status(V45.89)    Overweight 11/29/2013   Preventative health care 11/23/2014   Past Surgical History:  Procedure Laterality Date   DILATION AND CURETTAGE OF UTERUS     HEMORRHOID SURGERY     mastectomy,right     TUBAL LIGATION      Current Outpatient Medications:    meloxicam  (MOBIC ) 15 MG tablet, Take 1 tablet (15 mg total) by mouth daily., Disp: 30 tablet, Rfl: 3   methylPREDNISolone  (MEDROL  DOSEPAK) 4 MG TBPK tablet, 6 day dose pack - take as directed, Disp: 21 tablet, Rfl: 0   ipratropium (ATROVENT) 0.03 % nasal spray, 2 sprays in each nostril Nasally Twice a day, Disp: , Rfl:   No Known Allergies Review of Systems Objective:  There were no vitals filed for this visit.  General: Well developed, nourished, in no acute distress, alert and oriented x3   Dermatological: Skin is warm, dry and supple bilateral. Nails x 10 are well maintained; remaining integument appears unremarkable at this time. There are no open sores, no preulcerative  lesions, no rash or signs of infection present.  Vascular: Dorsalis Pedis artery and Posterior Tibial artery pedal pulses are 2/4 bilateral with immedate capillary fill time. Pedal hair growth present. No varicosities and no lower extremity edema present bilateral.   Neruologic: Grossly intact via light touch bilateral. Vibratory intact via tuning fork bilateral. Protective threshold with Semmes Wienstein monofilament intact to all pedal sites bilateral. Patellar and Achilles deep tendon reflexes 2+ bilateral. No Babinski or clonus noted bilateral.   Musculoskeletal: No gross boney pedal deformities bilateral. No pain, crepitus, or limitation noted with foot and ankle range of motion bilateral. Muscular strength 5/5 in all groups tested bilateral.  She has some inflammation around the first metatarsal phalangeal joint of the left foot is mildly tender appears to be like a medial collateral ligament or asses point suspensory ligament.  Does not demonstrate any type of intra-articular complications.  Both feet do demonstrate hallux valgus deformity with mild hallux interphalangeal.  Gait: Unassisted, Nonantalgic.    Radiographs:  Radiographs taken today demonstrate osseously mature individual only significant findings are hallux valgus deformities with hallux interphalangeal.  Also some osteoarthritic change at the talonavicular joint of the left foot.  And a bipartite sesamoid tibial hallux left.  Assessment & Plan:   Assessment: Probable sprain of the first metatarsal phalangeal joint with some neuritis.  Left foot.  Osteoarthritis dorsal aspect left  foot.   Plan: Dispensed Darco shoe and dispensed prescription for methylprednisolone  followed by meloxicam  and I will follow-up with her in 4 to 6 weeks.     Carley Strickling T. [place], [place]

## 2023-08-24 DIAGNOSIS — I10 Essential (primary) hypertension: Secondary | ICD-10-CM | POA: Diagnosis not present

## 2023-08-25 ENCOUNTER — Telehealth: Payer: Self-pay | Admitting: Podiatry

## 2023-08-25 NOTE — Telephone Encounter (Signed)
 Patient called in to check on the status of a form for handicap placard. She left form at our front desk on 08/21/23 .

## 2023-08-25 NOTE — Telephone Encounter (Signed)
 Still not filled out.

## 2023-08-28 NOTE — Telephone Encounter (Signed)
 Will do!

## 2023-08-30 NOTE — Telephone Encounter (Signed)
 Completed and notified patient on 08/29/2023.

## 2023-08-31 DIAGNOSIS — M1990 Unspecified osteoarthritis, unspecified site: Secondary | ICD-10-CM | POA: Diagnosis not present

## 2023-08-31 DIAGNOSIS — E7800 Pure hypercholesterolemia, unspecified: Secondary | ICD-10-CM | POA: Diagnosis not present

## 2023-08-31 DIAGNOSIS — I10 Essential (primary) hypertension: Secondary | ICD-10-CM | POA: Diagnosis not present

## 2023-08-31 DIAGNOSIS — E669 Obesity, unspecified: Secondary | ICD-10-CM | POA: Diagnosis not present

## 2023-09-12 DIAGNOSIS — G4733 Obstructive sleep apnea (adult) (pediatric): Secondary | ICD-10-CM | POA: Diagnosis not present

## 2023-09-20 ENCOUNTER — Telehealth: Payer: Self-pay

## 2023-09-20 NOTE — Telephone Encounter (Signed)
 Patient got a post op shoe last week - it is damaged/falling apart. She will come by the [facility] office to exchange it after lunch this afternoon - she will need a small - please have it for her at the front desk - thank you!

## 2023-09-23 DIAGNOSIS — I10 Essential (primary) hypertension: Secondary | ICD-10-CM | POA: Diagnosis not present

## 2023-09-28 ENCOUNTER — Encounter: Payer: Self-pay | Admitting: Podiatry

## 2023-09-28 ENCOUNTER — Ambulatory Visit (INDEPENDENT_AMBULATORY_CARE_PROVIDER_SITE_OTHER): Admitting: Podiatry

## 2023-09-28 DIAGNOSIS — M19072 Primary osteoarthritis, left ankle and foot: Secondary | ICD-10-CM | POA: Diagnosis not present

## 2023-09-28 NOTE — Progress Notes (Signed)
 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  She states has been feeling a lot better but been wearing her Darco shoe routinely.  Objective: Vital signs stable alert oriented x 3.  Pulses are palpable.  She has good range of motion of the first metatarsophalangeal joint neighborly pain on range of motion with except as sharp dorsiflexion.  There is some swelling dorsally and some fluctuance dorsally I am able to feel spur ridge across the distal aspect of the first metatarsophalangeal joint consistent with osteoarthritis.  Assessment: Resolving sprain of the first metatarsophalangeal joint osteoarthritis of the first metatarsophalangeal joint is present.  Plan: Continue to wear stiff soled shoes she will follow-up with us  on an as-needed basis we did talk about topical anti-inflammatory such as Voltaren .

## 2023-10-01 DIAGNOSIS — I10 Essential (primary) hypertension: Secondary | ICD-10-CM | POA: Diagnosis not present

## 2023-10-01 DIAGNOSIS — M1990 Unspecified osteoarthritis, unspecified site: Secondary | ICD-10-CM | POA: Diagnosis not present

## 2023-10-01 DIAGNOSIS — E7800 Pure hypercholesterolemia, unspecified: Secondary | ICD-10-CM | POA: Diagnosis not present

## 2023-10-01 DIAGNOSIS — E669 Obesity, unspecified: Secondary | ICD-10-CM | POA: Diagnosis not present

## 2023-10-13 DIAGNOSIS — G4733 Obstructive sleep apnea (adult) (pediatric): Secondary | ICD-10-CM | POA: Diagnosis not present

## 2023-10-20 DIAGNOSIS — Z23 Encounter for immunization: Secondary | ICD-10-CM | POA: Diagnosis not present

## 2023-10-23 DIAGNOSIS — I10 Essential (primary) hypertension: Secondary | ICD-10-CM | POA: Diagnosis not present

## 2023-10-31 DIAGNOSIS — I10 Essential (primary) hypertension: Secondary | ICD-10-CM | POA: Diagnosis not present

## 2023-10-31 DIAGNOSIS — M1990 Unspecified osteoarthritis, unspecified site: Secondary | ICD-10-CM | POA: Diagnosis not present

## 2023-10-31 DIAGNOSIS — E669 Obesity, unspecified: Secondary | ICD-10-CM | POA: Diagnosis not present

## 2023-10-31 DIAGNOSIS — E7800 Pure hypercholesterolemia, unspecified: Secondary | ICD-10-CM | POA: Diagnosis not present

## 2023-11-02 DIAGNOSIS — M519 Unspecified thoracic, thoracolumbar and lumbosacral intervertebral disc disorder: Secondary | ICD-10-CM | POA: Diagnosis not present

## 2023-11-12 DIAGNOSIS — G4733 Obstructive sleep apnea (adult) (pediatric): Secondary | ICD-10-CM | POA: Diagnosis not present

## 2023-11-22 DIAGNOSIS — M5416 Radiculopathy, lumbar region: Secondary | ICD-10-CM | POA: Diagnosis not present

## 2023-11-27 DIAGNOSIS — E7800 Pure hypercholesterolemia, unspecified: Secondary | ICD-10-CM | POA: Diagnosis not present

## 2023-11-27 DIAGNOSIS — E669 Obesity, unspecified: Secondary | ICD-10-CM | POA: Diagnosis not present

## 2023-11-27 DIAGNOSIS — R7303 Prediabetes: Secondary | ICD-10-CM | POA: Diagnosis not present

## 2023-11-29 DIAGNOSIS — Z1231 Encounter for screening mammogram for malignant neoplasm of breast: Secondary | ICD-10-CM | POA: Diagnosis not present

## 2023-11-30 ENCOUNTER — Other Ambulatory Visit: Payer: Self-pay | Admitting: Family Medicine

## 2023-11-30 DIAGNOSIS — E7800 Pure hypercholesterolemia, unspecified: Secondary | ICD-10-CM | POA: Diagnosis not present

## 2023-11-30 DIAGNOSIS — I77810 Thoracic aortic ectasia: Secondary | ICD-10-CM

## 2023-11-30 DIAGNOSIS — Z0000 Encounter for general adult medical examination without abnormal findings: Secondary | ICD-10-CM | POA: Diagnosis not present

## 2023-11-30 DIAGNOSIS — R7303 Prediabetes: Secondary | ICD-10-CM | POA: Diagnosis not present

## 2023-11-30 DIAGNOSIS — E669 Obesity, unspecified: Secondary | ICD-10-CM | POA: Diagnosis not present

## 2023-12-01 DIAGNOSIS — E669 Obesity, unspecified: Secondary | ICD-10-CM | POA: Diagnosis not present

## 2023-12-01 DIAGNOSIS — E7800 Pure hypercholesterolemia, unspecified: Secondary | ICD-10-CM | POA: Diagnosis not present

## 2023-12-01 DIAGNOSIS — M1990 Unspecified osteoarthritis, unspecified site: Secondary | ICD-10-CM | POA: Diagnosis not present

## 2023-12-01 DIAGNOSIS — I10 Essential (primary) hypertension: Secondary | ICD-10-CM | POA: Diagnosis not present

## 2023-12-04 DIAGNOSIS — I10 Essential (primary) hypertension: Secondary | ICD-10-CM | POA: Diagnosis not present

## 2023-12-04 DIAGNOSIS — D229 Melanocytic nevi, unspecified: Secondary | ICD-10-CM | POA: Diagnosis not present

## 2023-12-04 DIAGNOSIS — Z9189 Other specified personal risk factors, not elsewhere classified: Secondary | ICD-10-CM | POA: Diagnosis not present

## 2023-12-04 DIAGNOSIS — I77810 Thoracic aortic ectasia: Secondary | ICD-10-CM | POA: Diagnosis not present

## 2023-12-04 DIAGNOSIS — D259 Leiomyoma of uterus, unspecified: Secondary | ICD-10-CM | POA: Diagnosis not present

## 2023-12-04 DIAGNOSIS — Z853 Personal history of malignant neoplasm of breast: Secondary | ICD-10-CM | POA: Diagnosis not present

## 2023-12-07 ENCOUNTER — Inpatient Hospital Stay
Admission: RE | Admit: 2023-12-07 | Discharge: 2023-12-07 | Disposition: A | Payer: Self-pay | Source: Ambulatory Visit | Attending: Family Medicine | Admitting: Family Medicine

## 2023-12-07 DIAGNOSIS — I77810 Thoracic aortic ectasia: Secondary | ICD-10-CM

## 2023-12-07 DIAGNOSIS — I7121 Aneurysm of the ascending aorta, without rupture: Secondary | ICD-10-CM | POA: Diagnosis not present

## 2023-12-07 MED ORDER — IOPAMIDOL (ISOVUE-370) INJECTION 76%
75.0000 mL | Freq: Once | INTRAVENOUS | Status: AC | PRN
  Administered 2023-12-07: 75 mL via INTRAVENOUS

## 2023-12-13 DIAGNOSIS — G4733 Obstructive sleep apnea (adult) (pediatric): Secondary | ICD-10-CM | POA: Diagnosis not present

## 2023-12-22 DIAGNOSIS — I10 Essential (primary) hypertension: Secondary | ICD-10-CM | POA: Diagnosis not present

## 2023-12-25 DIAGNOSIS — R911 Solitary pulmonary nodule: Secondary | ICD-10-CM | POA: Diagnosis not present

## 2023-12-31 DIAGNOSIS — I10 Essential (primary) hypertension: Secondary | ICD-10-CM | POA: Diagnosis not present

## 2023-12-31 DIAGNOSIS — E7800 Pure hypercholesterolemia, unspecified: Secondary | ICD-10-CM | POA: Diagnosis not present

## 2023-12-31 DIAGNOSIS — E669 Obesity, unspecified: Secondary | ICD-10-CM | POA: Diagnosis not present

## 2023-12-31 DIAGNOSIS — M1990 Unspecified osteoarthritis, unspecified site: Secondary | ICD-10-CM | POA: Diagnosis not present

## 2024-02-13 ENCOUNTER — Telehealth: Payer: Self-pay | Admitting: Podiatry

## 2024-02-13 NOTE — Telephone Encounter (Signed)
 Form left and Dr. Verta signed. I will fax to [employer] [phone]. No charge for this form.  Any going fwd $25 will be due.
